# Patient Record
Sex: Male | Born: 1975 | Hispanic: Yes | Marital: Married | Smoking: Never smoker
Health system: Southern US, Community
[De-identification: ages and names within clinical notes are randomized; demographics above are authoritative.]

## PROBLEM LIST (undated history)

## (undated) DIAGNOSIS — G709 Myoneural disorder, unspecified: Secondary | ICD-10-CM

## (undated) DIAGNOSIS — I10 Essential (primary) hypertension: Secondary | ICD-10-CM

## (undated) DIAGNOSIS — R030 Elevated blood-pressure reading, without diagnosis of hypertension: Secondary | ICD-10-CM

## (undated) DIAGNOSIS — K219 Gastro-esophageal reflux disease without esophagitis: Secondary | ICD-10-CM

## (undated) DIAGNOSIS — IMO0001 Reserved for inherently not codable concepts without codable children: Secondary | ICD-10-CM

## (undated) DIAGNOSIS — G4730 Sleep apnea, unspecified: Secondary | ICD-10-CM

## (undated) HISTORY — PX: NO PAST SURGERIES: SHX2092

## (undated) HISTORY — DX: Gastro-esophageal reflux disease without esophagitis: K21.9

## (undated) HISTORY — DX: Sleep apnea, unspecified: G47.30

## (undated) HISTORY — DX: Myoneural disorder, unspecified: G70.9

---

## 2014-07-03 ENCOUNTER — Emergency Department: Payer: Self-pay | Admitting: Emergency Medicine

## 2015-03-05 NOTE — Consult Note (Signed)
PATIENT NAME:  Peter Porter, Peter Porter MR#:  [id number] DATE OF BIRTH:  Jul 29, 1976  DATE OF CONSULTATION:  07/03/2014  REFERRING PHYSICIAN:   CONSULTING PHYSICIAN:  Huey Romans, MD  CHIEF COMPLAINT:  Traumatic laceration to the face with a chainsaw.   HISTORY OF PRESENT ILLNESS: The patient is a 39 year old Hispanic male who has been very healthy. He was working on cutting down a tree. He had the tip of the blade pop back towards his forehead and just slightly graze the skin of his forehead and nose. He has multiple lacerations on his forehead and nose. He did not have any loss of consciousness.  He is, certainly, in pain, but otherwise is awake and alert.   PAST MEDICAL HISTORY: Significant for being very healthy, and no general medical problems.   REVIEW OF SYSTEMS:  He no other challenges.  His vision is clear. He can breathe well through his nose.  He is not having a nosebleed, just bleeding from the superficial lacerations.   CURRENT MEDICATIONS: None.   DRUG ALLERGIES: NONE.   SOCIAL HISTORY: He does not smoke.   PHYSICAL EXAMINATION: The patient is awake and alert and has lacerations on his forehead. These run superior and angled towards his left side. The first one is starting at his right brow and going up towards the left.  It is 2.5 cm in length and through full thickness skin, but not into the muscle. There is one the left side that starts in the glabella and goes up to the left brow and into the left forehead. It is 3 cm in length. Again, through full thickness skin, there is one in the middle between them running parallel that does not go all the way through the skin. There are 2 lacerations on the nasal dorsum.  The inferior one runs from the right side upward onto the left side.  It is 3.1 cm.  The upper one runs parallel to it and is just 2 cm slightly below where the left forehead laceration ends. This is also through full thickness skin. There was a small flap on the inferior  border of the inferior nasal laceration. There is also a small flap in the superior border of the right forehead laceration.  There are no other injuries around the eyes.  Extraocular movements are good. His nose looks healthy inside.  These did not go to through the muscle or cartilage, but just superficial skin.  There are no other injuries to his neck or face noted.   IMPRESSION: The patient has multiple superficial lacerations to his forehead and nose. These are sewn in the Emergency Room and that was dictated in detail elsewhere. He had bacitracin and Band-Aids placed over the wound.   PLAN: He is to follow-up in the office in 6 days for suture removal. He is to keep some ointment and a Band-Aid over the wounds 24 hours a day. He can be out of work on Monday, but can then returned to work wearing a Band-Aid and a hat to protect it from sun. All this for the rest of week. I have given him my office number and address so he knows how to get there, and to call if there are any problems.    ____________________________ Huey Romans, MD phj:TT D: 07/03/2014 17:54:50 ET T: 07/03/2014 18:39:14 ET JOB#: [id number]  cc: Huey Romans, MD, <Dictator> Huey Romans MD ELECTRONICALLY SIGNED 07/04/2014 22:24

## 2015-03-05 NOTE — Op Note (Signed)
PATIENT NAME:  Peter Porter, Peter Porter MR#:  [id number] DATE OF BIRTH:  06-30-1976  DATE OF PROCEDURE:  07/03/2014  PREOPERATIVE DIAGNOSES: Multiple traumatic facial lacerations secondary to chain saw.   POSTOPERATIVE DIAGNOSIS:  Multiple traumatic facial lacerations secondary to chain saw.  OPERATIVE PROCEDURES:  1.  Simple repair of lacerations of the nose to a total of 5.1 cm.  2.  Simple repair of lacerations of the forehead a total of 5.5 cm.   SURGEON: Huey Romans, M.D.   ANESTHESIA: Local.   COMPLICATIONS: None.   DESCRIPTION OF PROCEDURE: The patient was seen in the Emergency Room and lacerations were noted. These are through the skin, but not into the deep tissues. This was a chain saw injury that had mostly vertical, slightly angled lacerations, 3 of them in a row running up from the glabella and eyebrows into the mid forehead. The one to the right side was 2.5 cm. The one to the left was 3 cm.  The midline one was more superficial and did not require any sutures. He had transverse lacerations across the bridge of his nose. The inferior one was 3.1 cm.  The more superior one was 2 cm.  Both these were through full thickness skin but were gaping only slightly.   The wound was wash and then 1% Xylocaine with epinephrine 1:200,000 was used for infiltration of the forehead, as well as the bridge of the nose. Approximately 6 mL were used for anesthesia. Once this was completed, the area was prepped with Betadine to clean it very carefully. Once it was clean, closure was done with a 6-0 Prolene sutures. This was done in interrupted and some running locking sutures. The nasal lacerations were done first and then the forehead lacerations were done. These had some little flaps at the distal edges from the chain saw where it cut through slightly on one side deeper than the other, and these were sutured with the 3-0 nylon suture. The forehead was then sutured next, and again interrupted and running  locking sutures were placed to complete the suture repair. The wounds were then covered with bacitracin ointment, followed by a Band-Aid dressing. The patient tolerated the procedure well. This was done in the Emergency Room at his bedside. There were no operative complications.   ____________________________ Huey Romans, MD phj:TT D: 07/03/2014 17:49:37 ET T: 07/03/2014 20:21:46 ET JOB#: [id number]  cc: Huey Romans, MD, <Dictator> Huey Romans MD ELECTRONICALLY SIGNED 07/04/2014 22:24

## 2015-10-03 ENCOUNTER — Other Ambulatory Visit: Payer: Self-pay | Admitting: Family Medicine

## 2015-10-03 DIAGNOSIS — R2241 Localized swelling, mass and lump, right lower limb: Secondary | ICD-10-CM

## 2015-10-21 ENCOUNTER — Ambulatory Visit: Payer: Self-pay

## 2015-12-27 ENCOUNTER — Other Ambulatory Visit: Payer: Self-pay | Admitting: Student

## 2015-12-27 DIAGNOSIS — M25461 Effusion, right knee: Secondary | ICD-10-CM

## 2016-01-13 ENCOUNTER — Ambulatory Visit
Admission: RE | Admit: 2016-01-13 | Discharge: 2016-01-13 | Disposition: A | Discharge status: Home / Self Care | Payer: BLUE CROSS/BLUE SHIELD | Source: Ambulatory Visit | Attending: Student | Admitting: Student

## 2016-01-13 DIAGNOSIS — M25461 Effusion, right knee: Secondary | ICD-10-CM | POA: Insufficient documentation

## 2016-01-13 DIAGNOSIS — M23051 Cystic meniscus, posterior horn of lateral meniscus, right knee: Secondary | ICD-10-CM | POA: Diagnosis not present

## 2016-05-16 ENCOUNTER — Encounter: Payer: Self-pay | Admitting: *Deleted

## 2016-05-16 ENCOUNTER — Other Ambulatory Visit: Payer: BLUE CROSS/BLUE SHIELD

## 2016-05-16 NOTE — Patient Instructions (Signed)
  Your procedure is scheduled on: 05-22-16  Report to Same Day Surgery 2nd floor medical mall To find out your arrival time please call [phone] between 1PM - 3PM on 05-21-16  Remember: Instructions that are not followed completely may result in serious medical risk, up to and including death, or upon the discretion of your surgeon and anesthesiologist your surgery may need to be rescheduled.    _x___ 1. Do not eat food or drink liquids after midnight. No gum chewing or hard candies.     __x__ 2. No Alcohol for 24 hours before or after surgery.   __x__3. No Smoking for 24 prior to surgery.   ____  4. Bring all medications with you on the day of surgery if instructed.    __x__ 5. Notify your doctor if there is any change in your medical condition     (cold, fever, infections).     Do not wear jewelry, make-up, hairpins, clips or nail polish.  Do not wear lotions, powders, or perfumes. You may wear deodorant.  Do not shave 48 hours prior to surgery. Men may shave face and neck.  Do not bring valuables to the hospital.    [facility] is not responsible for any belongings or valuables.               Contacts, dentures or bridgework may not be worn into surgery.  Leave your suitcase in the car. After surgery it may be brought to your room.  For patients admitted to the hospital, discharge time is determined by your treatment team.   Patients discharged the day of surgery will not be allowed to drive home.    Please read over the following fact sheets that you were given:   [facility] Preparing for Surgery and or MRSA Information   ____ Take these medicines the morning of surgery with A SIP OF WATER:    1. NONE  2.  3.  4.  5.  6.  ____ Fleet Enema (as directed)   ____ Use CHG Soap or sage wipes as directed on instruction sheet   ____ Use inhalers on the day of surgery and bring to hospital day of surgery  ____ Stop metformin 2 days prior to surgery    ____ Take 1/2 of  usual insulin dose the night before surgery and none on the morning of surgery.   ____ Stop aspirin or coumadin, or plavix  _x__ Stop Anti-inflammatories such as Advil, Aleve, Ibuprofen, Motrin, Naproxen,          Naprosyn, Goodies powders or aspirin products. Ok to take Tylenol.   ____ Stop supplements until after surgery.    ____ Bring C-Pap to the hospital.

## 2016-05-22 ENCOUNTER — Ambulatory Visit: Admission: RE | Admit: 2016-05-22 | Payer: BLUE CROSS/BLUE SHIELD | Source: Ambulatory Visit | Admitting: Surgery

## 2016-05-22 HISTORY — DX: Reserved for inherently not codable concepts without codable children: IMO0001

## 2016-05-22 HISTORY — DX: Elevated blood-pressure reading, without diagnosis of hypertension: R03.0

## 2016-05-22 SURGERY — ARTHROSCOPY, KNEE, WITH MENISCUS REPAIR
Anesthesia: Choice | Laterality: Right

## 2016-12-01 IMAGING — MR MR KNEE*R* W/O CM
5 series · 39 of 40 positions shown · non-contrast
Comparison: None.

CLINICAL DATA: Swelling about the anterior aspect of the right
knee. No recent injury. History of injury playing soccer 4 years
ago. Initial encounter.

EXAM:
MRI OF THE RIGHT KNEE WITHOUT CONTRAST
TECHNIQUE: Multiplanar, multisequence MR imaging of the knee was performed. No
intravenous contrast was administered.

[Series 3: PD fat-sat · axial · 3.0mm · 0.31mm/px · z∈[-57,+65]mm · 8 of 38 slices shown (1 of 3)]
[im 1/38]
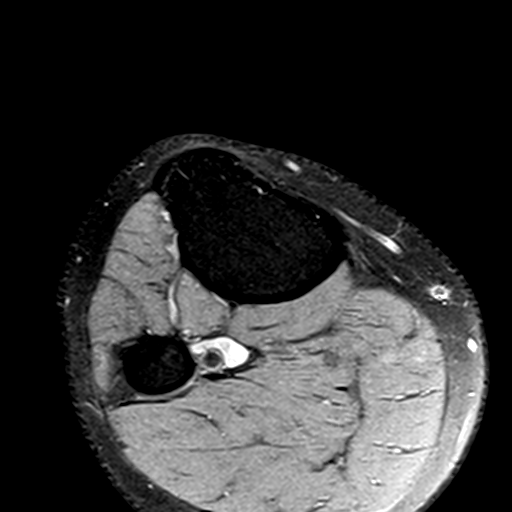
[im 6/38]
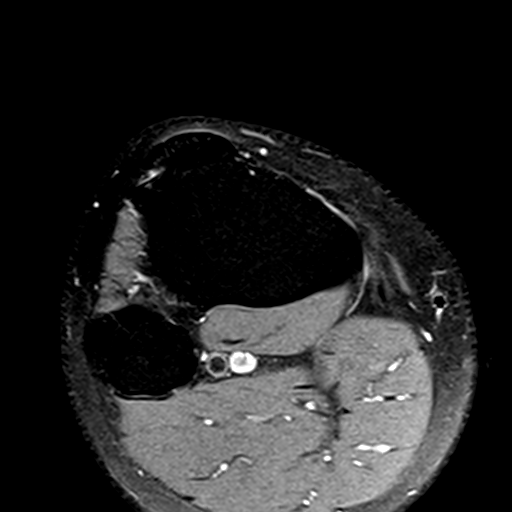
[im 11/38]
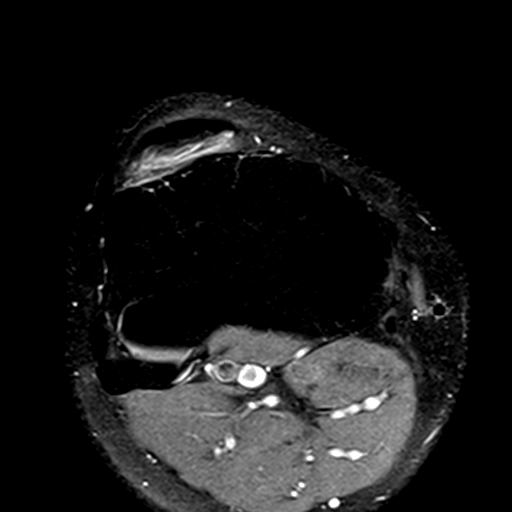
[im 16/38]
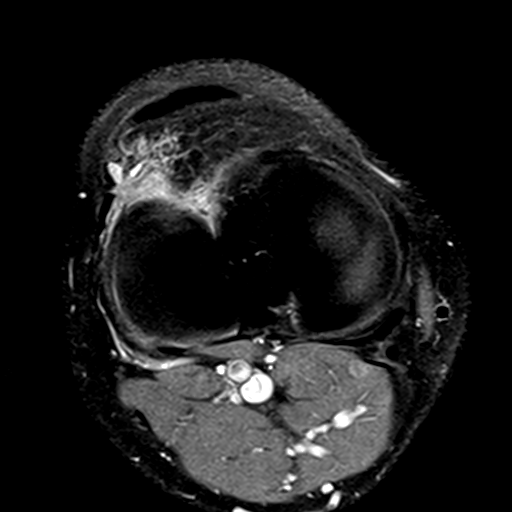
[im 22/38]
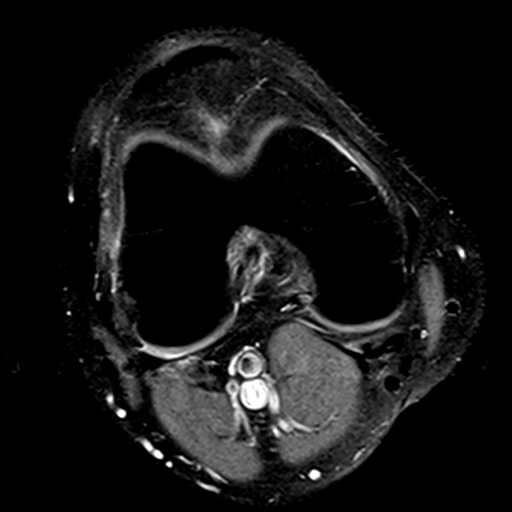
[im 27/38]
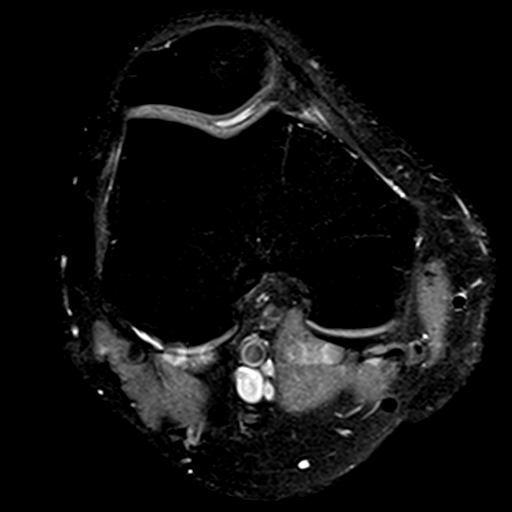
[im 32/38]
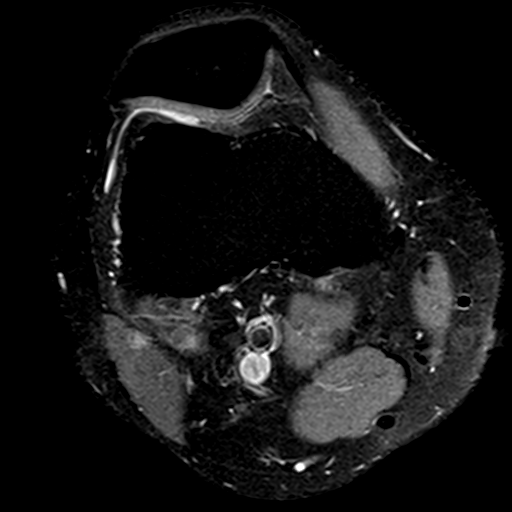
[im 38/38]
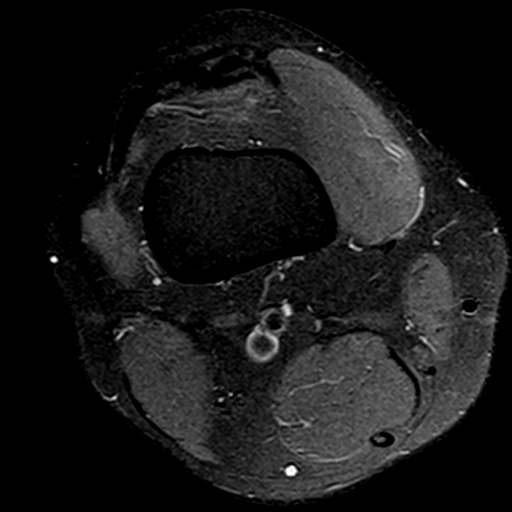

[Series 4: T1 · coronal · 3.0mm · 0.50mm/px · 7 of 36 slices shown]
[im 1/36]
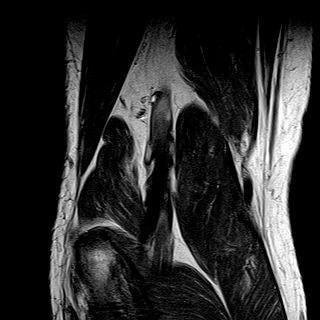
[im 6/36]
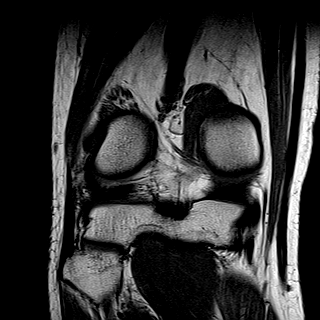
[im 11/36]
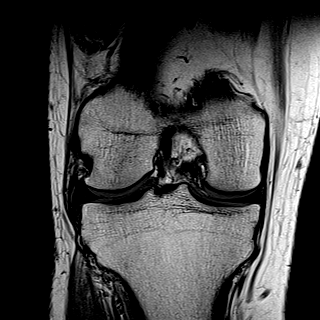
[im 16/36]
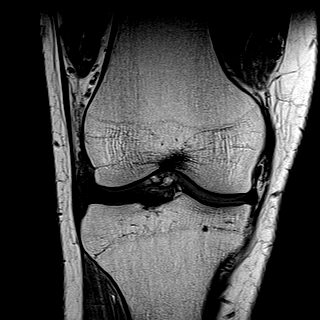
[im 21/36]
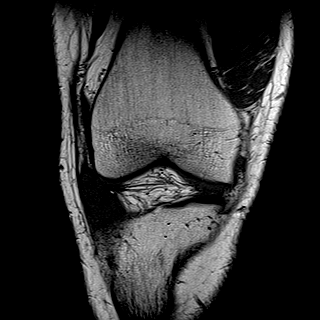
[im 26/36]
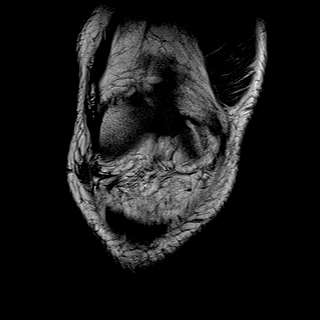
[im 31/36]
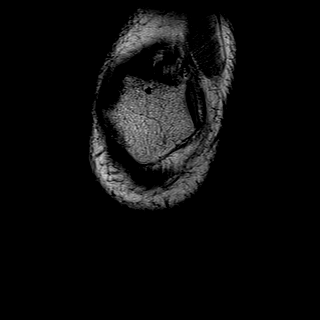

[Series 5: PD fat-sat · sagittal · 3.0mm · 0.62mm/px · 8 of 35 slices shown (2 of 3)]
[im 1/35]
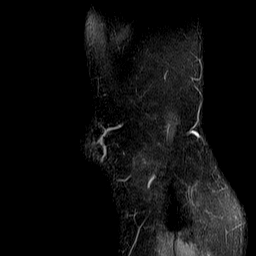
[im 5/35]
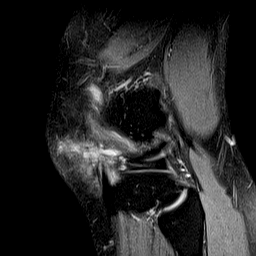
[im 10/35]
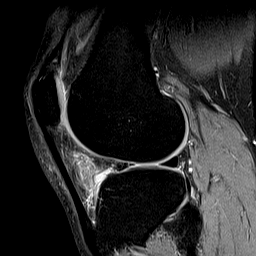
[im 15/35]
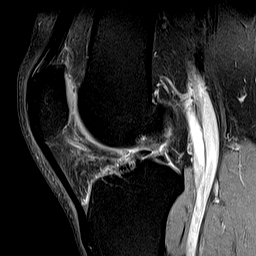
[im 20/35]
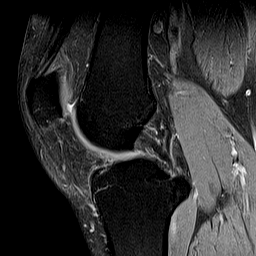
[im 25/35]
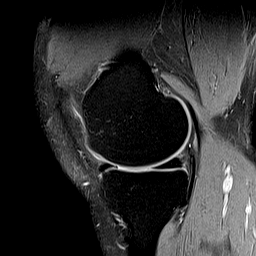
[im 30/35]
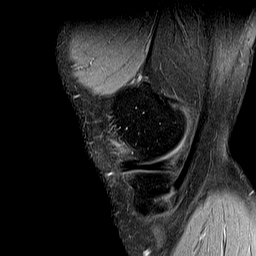
[im 35/35]
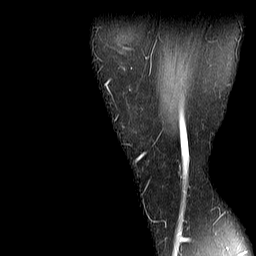

[Series 6: T2 fat-sat · coronal · 3.0mm · 0.50mm/px · 8 of 36 slices shown]
[im 1/36]
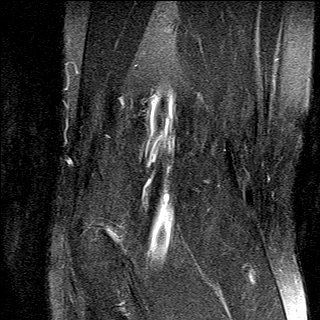
[im 6/36]
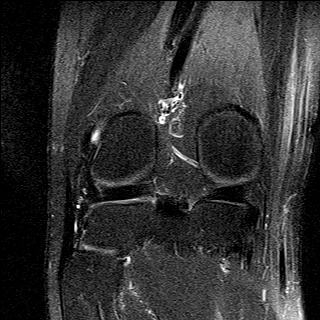
[im 11/36]
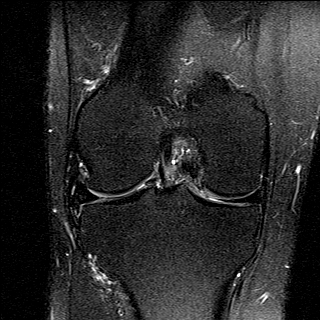
[im 16/36]
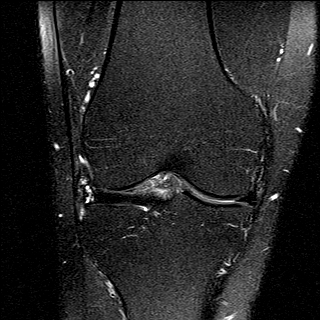
[im 21/36]
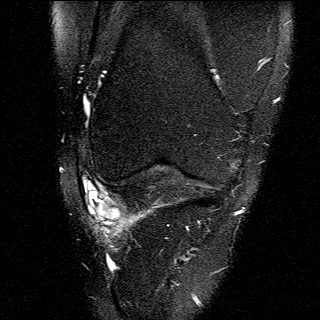
[im 26/36]
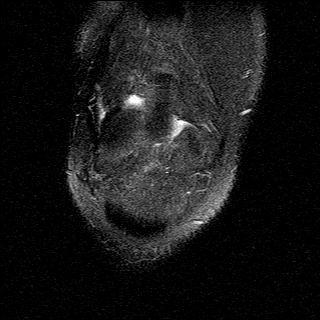
[im 31/36]
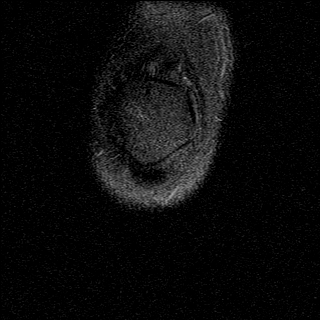
[im 36/36]
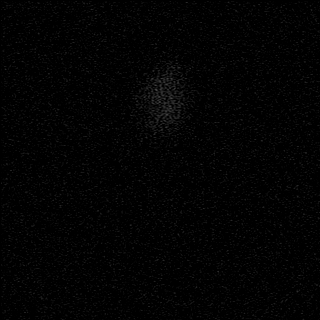

[Series 7: PD fat-sat · coronal · 3.0mm · 0.62mm/px · 8 of 36 slices shown (3 of 3)]
[im 1/36]
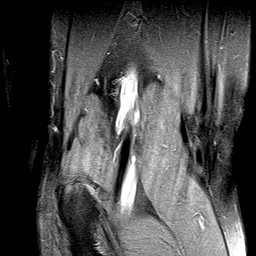
[im 6/36]
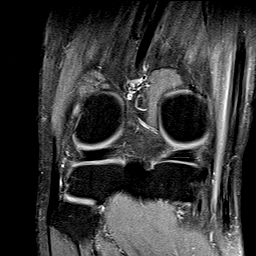
[im 11/36]
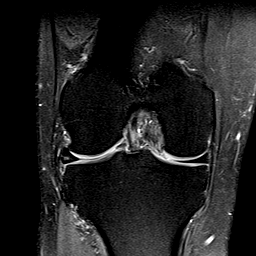
[im 16/36]
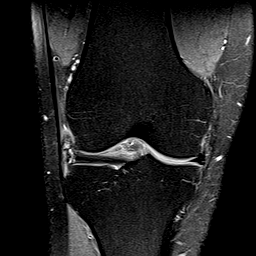
[im 21/36]
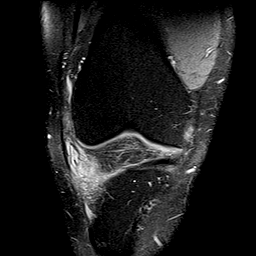
[im 26/36]
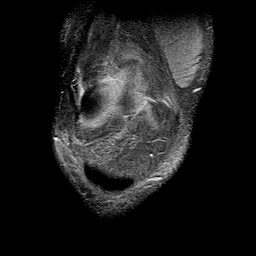
[im 31/36]
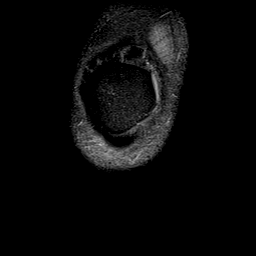
[im 36/36]
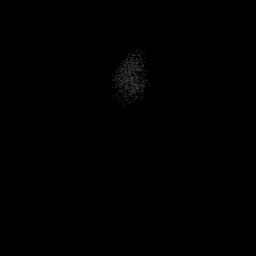

[39 of 40 positions shown; findings below may reference images not displayed]

FINDINGS: Intact.

MENISCI

Medial meniscus: Mild fraying is seen along the free edge of the
body. At the junction of anterior horn and body, there is a cystic
lesion contiguous with the capsular surface of the meniscus which
measures 2.0 cm AP x 2.2 cm craniocaudal x 1.8 cm transverse. This
could be a ganglion cyst or parameniscal cyst due to a capsular
surface tear. No meniscal tear reaching an articular surface is
identified.

Lateral meniscus:  Intact.

LIGAMENTS

Cruciates:  Intact.

Collaterals:  Intact.

CARTILAGE

Patellofemoral:  Appears normal.

Medial:  Appears normal.

Lateral:  Appears normal.

Joint:  No joint there is a trace amount of joint fluid.

Popliteal Fossa:  No Baker's cyst.

Extensor Mechanism:  Intact.

Bones:  Normal marrow signal throughout.
IMPRESSION: Cystic lesion adjacent to the capsular surface of the lateral
meniscus at the junction of the anterior horn and body could be a
ganglion cyst or a parameniscal cyst. There may be a tear of the
capsular surface of the lateral meniscus but no tear reaching an
articular surface is identified.

Negative for medial meniscal or cruciate ligament tear.

## 2020-01-30 ENCOUNTER — Ambulatory Visit: Payer: BC Managed Care – PPO | Attending: Internal Medicine

## 2020-01-30 DIAGNOSIS — Z23 Encounter for immunization: Secondary | ICD-10-CM

## 2020-01-30 NOTE — Progress Notes (Signed)
   Covid-19 Vaccination Clinic  Name:  Dwaun Sosebee    MRN: [id number] DOB: 1976-06-11  01/30/2020  Mr. Maka was observed post Covid-19 immunization for 15 minutes without incident. He was provided with Vaccine Information Sheet and instruction to access the V-Safe system.   Mr. Suiter was instructed to call 911 with any severe reactions post vaccine: Marland Kitchen Difficulty breathing  . Swelling of face and throat  . A fast heartbeat  . A bad rash all over body  . Dizziness and weakness   Immunizations Administered    Name Date Dose VIS Date Route   Pfizer COVID-19 Vaccine 01/30/2020  6:22 PM 0.3 mL 10/23/2019 Intramuscular   Manufacturer: [place]   Lot: [id number]   [place]: [id number]

## 2020-02-21 ENCOUNTER — Ambulatory Visit: Payer: BC Managed Care – PPO | Attending: Internal Medicine

## 2020-02-21 DIAGNOSIS — Z23 Encounter for immunization: Secondary | ICD-10-CM

## 2020-02-21 NOTE — Progress Notes (Signed)
   Covid-19 Vaccination Clinic  Name:  Lazer Meloche    MRN: [id number] DOB: March 10, 1976  02/21/2020  Mr. Pisano was observed post Covid-19 immunization for 15 minutes without incident. He was provided with Vaccine Information Sheet and instruction to access the V-Safe system.   Mr. Sylvain was instructed to call 911 with any severe reactions post vaccine: Marland Kitchen Difficulty breathing  . Swelling of face and throat  . A fast heartbeat  . A bad rash all over body  . Dizziness and weakness   Immunizations Administered    Name Date Dose VIS Date Route   Pfizer COVID-19 Vaccine 02/21/2020  3:36 PM 0.3 mL 10/23/2019 Intramuscular   Manufacturer: [place]   Lot: [id number]   [place]: [id number]

## 2020-03-11 ENCOUNTER — Ambulatory Visit: Payer: BC Managed Care – PPO

## 2021-02-27 ENCOUNTER — Other Ambulatory Visit: Payer: Self-pay

## 2021-02-27 ENCOUNTER — Ambulatory Visit
Admission: EM | Admit: 2021-02-27 | Discharge: 2021-02-27 | Disposition: A | Discharge status: Home / Self Care | Payer: BC Managed Care – PPO

## 2021-02-27 DIAGNOSIS — L989 Disorder of the skin and subcutaneous tissue, unspecified: Secondary | ICD-10-CM

## 2021-02-27 HISTORY — DX: Essential (primary) hypertension: I10

## 2021-02-27 NOTE — Discharge Instructions (Addendum)
You need to see a dermatologist to have the skin lesion on your eyelid and on your neck evaluated.  I have secured an appointment for you with [facility] this Wednesday, 03/01/2021 at 12 PM.  Their address is [address]. [place], [place]. [address] [phone]

## 2021-02-27 NOTE — ED Provider Notes (Signed)
[facility]    CSN: [id number] Arrival date & time: 02/27/21  0846      History   Chief Complaint Chief Complaint  Patient presents with  . Eyelid Problem    Right lower    HPI Peter Porter is a 45 y.o. male.   HPI   45 year old male here for evaluation of a growth on his right eyelid and left neck.  Patient reports that the growth on his right eyelid has been getting bigger over the last year and he is concerned that it might be cancer.  Patient also has another area on his left neck near his collarbone that he is also worried about.  Patient reports that he has been evaluated by his PCP who stated that they would make a referral for him but has not heard from anybody.  He called them again today and he was directed to come to the urgent care to have this matter remedied.  Spanish interpreter Peter Porter [phone] used for HPI, assessment, and discharge.  Past Medical History:  Diagnosis Date  . Elevated blood pressure    NO MEDS  . Hypertension     There are no problems to display for this patient.   Past Surgical History:  Procedure Laterality Date  . NO PAST SURGERIES         Home Medications    Prior to Admission medications   Not on File    Family History History reviewed. No pertinent family history.  Social History Social History   Tobacco Use  . Smoking status: Never Smoker  . Smokeless tobacco: Never Used  Vaping Use  . Vaping Use: Never used  Substance Use Topics  . Alcohol use: No  . Drug use: No     Allergies   Patient has no known allergies.   Review of Systems Review of Systems  Skin: Negative for color change and wound.     Physical Exam Triage Vital Signs ED Triage Vitals  Enc Vitals Group     BP 02/27/21 0925 (!) 150/123     Pulse Rate 02/27/21 0925 (!) 59     Resp 02/27/21 0925 18     Temp 02/27/21 0925 97.9 F (36.6 C)     Temp Source 02/27/21 0925 Oral     SpO2 02/27/21 0925 100 %     Weight  02/27/21 0922 240 lb (108.9 kg)     Height 02/27/21 0922 5\' 9"  (1.753 m)     Head Circumference --      Peak Flow --      Pain Score 02/27/21 0922 0     Pain Loc --      Pain Edu? --      Excl. in [place]? --    No data found.  Updated Vital Signs BP (!) 150/123 (BP Location: Left Arm)   Pulse (!) 59   Temp 97.9 F (36.6 C) (Oral)   Resp 18   Ht 5\' 9"  (1.753 m)   Wt 240 lb (108.9 kg)   SpO2 100%   BMI 35.44 kg/m   Visual Acuity Right Eye Distance:   Left Eye Distance:   Bilateral Distance:    Right Eye Near:   Left Eye Near:    Bilateral Near:     Physical Exam Vitals and nursing note reviewed.  Constitutional:      General: He is not in acute distress.    Appearance: Normal appearance. He is normal weight.  HENT:  Head: Normocephalic and atraumatic.  Skin:    General: Skin is warm and dry.     Capillary Refill: Capillary refill takes less than 2 seconds.     Findings: Lesion present.  Neurological:     General: No focal deficit present.     Mental Status: He is alert and oriented to person, place, and time.  Psychiatric:        Mood and Affect: Mood normal.        Behavior: Behavior normal.        Thought Content: Thought content normal.        Judgment: Judgment normal.      UC Treatments / Results  Labs (all labs ordered are listed, but only abnormal results are displayed) Labs Reviewed - No data to display  EKG   Radiology No results found.  Procedures Procedures (including critical care time)  Medications Ordered in UC Medications - No data to display  Initial Impression / Assessment and Plan / UC Course  I have reviewed the triage vital signs and the nursing notes.  Pertinent labs & imaging results that were available during my care of the patient were reviewed by me and considered in my medical decision making (see chart for details).   Patient is a very pleasant 45 year old Spanish-speaking male here for evaluation of a growth on his  right eyelid and on his left neck that has been present for at least a year.  Patient reports that they have been growing in size and he is concerned that they might be cancer.  Patient was directed to come here by his PCP.  I informed the patient that we cannot do any removal of skin lesions or biopsies to determine if there are cancer and that he would need to be seen by dermatology.  Patient reports that he was assured by his PCP that we could take care of this matter and I reinforced to him that I do not have the ability to collect a biopsy, remove skin lesions, or skin tags.  Patient does have a 5 mm x 5 mm raised, multicolored lesion on his right lower eyelid just lateral of midline.  He has another smaller 1 mm x 1 mm skin tag on his left neck near his collarbone.  I informed patient that I would refer him to [facility] and try and get him an appointment.  I was able to secure patient appointment for this Wednesday, March 01, 2021 at 12 PM.   Final Clinical Impressions(s) / UC Diagnoses   Final diagnoses:  Skin lesion     Discharge Instructions     You need to see a dermatologist to have the skin lesion on your eyelid and on your neck evaluated.  I have secured an appointment for you with [facility] this Wednesday, 03/01/2021 at 12 PM.  Their address is [address]. [place], [place]. [address] [phone]    ED Prescriptions    None     PDMP not reviewed this encounter.   Margarette Canada, NP 02/27/21 1010

## 2021-02-27 NOTE — ED Triage Notes (Signed)
Pt c/o possible growth under his right eyelid. Pt states it seems to be getting bigger over the past few months. Pt denies any pain. Pt also has a possible mole on his left chest/clavicle area.

## 2021-03-01 ENCOUNTER — Ambulatory Visit: Payer: BC Managed Care – PPO | Admitting: Dermatology

## 2021-03-01 ENCOUNTER — Other Ambulatory Visit: Payer: Self-pay

## 2021-03-01 DIAGNOSIS — L814 Other melanin hyperpigmentation: Secondary | ICD-10-CM | POA: Diagnosis not present

## 2021-03-01 DIAGNOSIS — D229 Melanocytic nevi, unspecified: Secondary | ICD-10-CM

## 2021-03-01 DIAGNOSIS — L820 Inflamed seborrheic keratosis: Secondary | ICD-10-CM

## 2021-03-01 DIAGNOSIS — L821 Other seborrheic keratosis: Secondary | ICD-10-CM

## 2021-03-01 NOTE — Patient Instructions (Signed)

## 2021-03-01 NOTE — Progress Notes (Signed)
   New Patient Visit  Subjective  Peter Porter is a 45 y.o. male who presents for the following: Lesions (Of the right lower eyelid, L cheek, and L neck - growing in size and symptomatic; patient is concerned and would like to discuss treatment options). Interpreter used today Verdis Frederickson   The following portions of the chart were reviewed this encounter and updated as appropriate:   Tobacco  Allergies  Meds  Problems  Med Hx  Surg Hx  Fam Hx     Review of Systems:  No other skin or systemic complaints except as noted in HPI or Assessment and Plan.  Objective  Well appearing patient in no apparent distress; mood and affect are within normal limits.  A focused examination was performed including the face and neck. Relevant physical exam findings are noted in the Assessment and Plan.  Objective  L cheek x 2, L lat neck x 1 (3): Erythematous keratotic or waxy stuck-on papule or plaque.   Images      Objective  R lower eyelid margin x 1: Erythematous keratotic or waxy stuck-on papule or plaque.   Images    Assessment & Plan  Inflamed seborrheic keratosis (3) L cheek x 2, L lat neck x 1  Destruction of lesion - L cheek x 2, L lat neck x 1 Complexity: simple   Destruction method: cryotherapy   Informed consent: discussed and consent obtained   Timeout:  patient name, date of birth, surgical site, and procedure verified Lesion destroyed using liquid nitrogen: Yes   Region frozen until ice ball extended beyond lesion: Yes   Outcome: patient tolerated procedure well with no complications   Post-procedure details: wound care instructions given    Seborrheic keratosis, inflamed R lower eyelid margin x 1  Destruction of lesion - R lower eyelid margin x 1 Complexity: simple   Destruction method: cryotherapy   Informed consent: discussed and consent obtained   Timeout:  patient name, date of birth, surgical site, and procedure verified Lesion destroyed using liquid  nitrogen: Yes   Region frozen until ice ball extended beyond lesion: Yes   Outcome: patient tolerated procedure well with no complications   Post-procedure details: wound care instructions given     Seborrheic Keratoses - Stuck-on, waxy, tan-brown papules and/or plaques  - Benign-appearing - Discussed benign etiology and prognosis. - Observe - Call for any changes  Lentigines - Scattered tan macules - Due to sun exposure - Benign-appering, observe - Recommend daily broad spectrum sunscreen SPF 30+ to sun-exposed areas, reapply every 2 hours as needed. - Call for any changes  Melanocytic Nevi - Tan-brown and/or pink-flesh-colored symmetric macules and papules - Benign appearing on exam today - Observation - Call clinic for new or changing moles - Recommend daily use of broad spectrum spf 30+ sunscreen to sun-exposed areas.   Return in 2 months (on 05/01/2021), or if symptoms worsen or fail to improve, for ISK recheck .  Luther Redo, CMA, am acting as scribe for Sarina Ser, MD .  Documentation: I have reviewed the above documentation for accuracy and completeness, and I agree with the above.  Sarina Ser, MD

## 2021-03-05 ENCOUNTER — Encounter: Payer: Self-pay | Admitting: Dermatology

## 2021-05-04 ENCOUNTER — Ambulatory Visit: Payer: BC Managed Care – PPO | Admitting: Dermatology

## 2021-05-04 ENCOUNTER — Other Ambulatory Visit: Payer: Self-pay

## 2022-09-19 DIAGNOSIS — B349 Viral infection, unspecified: Secondary | ICD-10-CM | POA: Diagnosis not present

## 2022-11-06 DIAGNOSIS — S29012A Strain of muscle and tendon of back wall of thorax, initial encounter: Secondary | ICD-10-CM | POA: Diagnosis not present

## 2023-01-21 ENCOUNTER — Ambulatory Visit: Payer: BC Managed Care – PPO | Admitting: Physician Assistant

## 2023-01-21 ENCOUNTER — Encounter: Payer: Self-pay | Admitting: Physician Assistant

## 2023-01-21 VITALS — BP 138/108 | HR 95 | Wt 244.0 lb

## 2023-01-21 DIAGNOSIS — D3192 Benign neoplasm of unspecified part of left eye: Secondary | ICD-10-CM | POA: Insufficient documentation

## 2023-01-21 DIAGNOSIS — E669 Obesity, unspecified: Secondary | ICD-10-CM | POA: Diagnosis not present

## 2023-01-21 DIAGNOSIS — Z1159 Encounter for screening for other viral diseases: Secondary | ICD-10-CM | POA: Insufficient documentation

## 2023-01-21 DIAGNOSIS — K219 Gastro-esophageal reflux disease without esophagitis: Secondary | ICD-10-CM

## 2023-01-21 DIAGNOSIS — D3191 Benign neoplasm of unspecified part of right eye: Secondary | ICD-10-CM | POA: Insufficient documentation

## 2023-01-21 DIAGNOSIS — Z7689 Persons encountering health services in other specified circumstances: Secondary | ICD-10-CM | POA: Insufficient documentation

## 2023-01-21 DIAGNOSIS — Z114 Encounter for screening for human immunodeficiency virus [HIV]: Secondary | ICD-10-CM | POA: Diagnosis not present

## 2023-01-21 DIAGNOSIS — H579 Unspecified disorder of eye and adnexa: Secondary | ICD-10-CM | POA: Insufficient documentation

## 2023-01-21 MED ORDER — OMEPRAZOLE 20 MG PO CPDR: 20.0000 mg | DELAYED_RELEASE_CAPSULE | Freq: Every day | ORAL | 3 refills | Status: DC

## 2023-01-21 NOTE — Progress Notes (Signed)
New patient visit   Patient: Peter Porter   DOB: Dec 09, 1975   47 y.o. Male  MRN: [id number] Visit Date: 01/21/2023  Today's healthcare provider: Mardene Speak, PA-C   CC: establish care, GERD exacerbation, neck pain  Subjective    Peter Porter is a 47 y.o. male who presents today as a new patient to establish care.  Patient complains of significant epigastric pain. This has been associated with abdominal bloating, belching, fullness after meals, and heartburn. He denies bilious reflux, chest pain, choking on food, cough, deep pressure at base of neck, difficulty swallowing, hematemesis, hoarseness, and laryngitis. Symptoms have been present for years. He denies dysphagia. Peter Porter has not lost weight. He had used Prilosec with some relief but then it did not work any more.  Pain associated with meals, it is worse in the mornings, disturbs his sleep, makes him dizzy and makes his body weak  Reports having neck pain with head movements, numbness and tingling sensation in his left arm. Onset: December of 2023. Denies having trauma or injury. Does heavy lifting at work. Tried chiropractic sessions. Spanish-speaking interpreter: Peter Porter  Past Medical History:  Diagnosis Date   Elevated blood pressure    NO MEDS   GERD (gastroesophageal reflux disease)    Hypertension    Neuromuscular disorder (HCC)    Sleep apnea    Past Surgical History:  Procedure Laterality Date   NO PAST SURGERIES     Family Status  Relation Name Status   Mother  (Not Specified)   Father  (Not Specified)   PGM  (Not Specified)   Family History  Problem Relation Age of Onset   Osteoporosis Mother    Diabetes Father    Cancer Paternal Grandmother    Social History   Socioeconomic History   Marital status: Married    Spouse name: Not on file   Number of children: Not on file   Years of education: Not on file   Highest education level: Not on file  Occupational History   Not on file  Tobacco Use    Smoking status: Never   Smokeless tobacco: Never  Vaping Use   Vaping Use: Never used  Substance and Sexual Activity   Alcohol use: Yes    Alcohol/week: 3.0 standard drinks of alcohol    Types: 1 Glasses of wine, 2 Cans of beer per week   Drug use: No   Sexual activity: Not on file  Other Topics Concern   Not on file  Social History Narrative   Not on file   Social Determinants of Health   Financial Resource Strain: Not on file  Food Insecurity: Not on file  Transportation Needs: Not on file  Physical Activity: Not on file  Stress: Not on file  Social Connections: Not on file   No outpatient medications prior to visit.   No facility-administered medications prior to visit.   No Known Allergies  Immunization History  Administered Date(s) Administered   PFIZER(Purple Top)SARS-COV-2 Vaccination 01/30/2020, 02/21/2020    Health Maintenance  Topic Date Due   HIV Screening  Never done   Hepatitis C Screening  Never done   DTaP/Tdap/Td (1 - Tdap) Never done   COVID-19 Vaccine (3 - Pfizer risk series) 03/20/2020   INFLUENZA VACCINE  Never done   HPV VACCINES  Aged Out    Patient Care Team: Patient, No Pcp Per as PCP - General (General Practice)  Review of Systems  Respiratory:  Positive for apnea.  Gastrointestinal:  Positive for abdominal distention, abdominal pain and nausea.  Musculoskeletal:  Positive for neck pain.  Neurological:  Positive for numbness.       Objective    BP (!) 138/108 (BP Location: Left Arm, Patient Position: Sitting, Cuff Size: Large)   Pulse 95   Wt 244 lb (110.7 kg)   SpO2 95%   BMI 36.03 kg/m    Physical Exam Vitals reviewed.  Constitutional:      General: He is not in acute distress.    Appearance: Normal appearance. He is not diaphoretic.  HENT:     Head: Normocephalic and atraumatic.  Eyes:     General: No scleral icterus.    Conjunctiva/sclera: Conjunctivae normal.  Cardiovascular:     Rate and Rhythm: Normal rate  and regular rhythm.     Pulses: Normal pulses.     Heart sounds: Normal heart sounds. No murmur heard. Pulmonary:     Effort: Pulmonary effort is normal. No respiratory distress.     Breath sounds: Normal breath sounds. No wheezing or rhonchi.  Musculoskeletal:     Cervical back: Neck supple.     Right lower leg: No edema.     Left lower leg: No edema.  Lymphadenopathy:     Cervical: No cervical adenopathy.  Skin:    General: Skin is warm and dry.     Findings: No rash.  Neurological:     Mental Status: He is alert and oriented to person, place, and time. Mental status is at baseline.  Psychiatric:        Mood and Affect: Mood normal.        Behavior: Behavior normal.     Depression Screen    01/21/2023    2:25 PM  PHQ 2/9 Scores  PHQ - 2 Score 1  PHQ- 9 Score 3   No results found for any visits on 01/21/23.  Assessment & Plan     Obesity (BMI 30-39.9) Chronic and stable  BMI today was 36.03 Initial workup - Lipid panel - CBC with Differential/Platelet - Comprehensive metabolic panel - TSH - omeprazole (PRILOSEC) 20 MG capsule; Take 1 capsule (20 mg total) by mouth daily.  Dispense: 60 capsule; Refill: 3 Advised low carb, low cholesterol diet and daily exercise. Will FU  Gastroesophageal reflux disease without esophagitis Chronic and worsening Recommended a trial of PPI Elevate the head of the bed 6-8 inches, avoid recumbency for 3 hours after eating, avoid food, weight loss  advised - omeprazole (PRILOSEC) 20 MG capsule; Take 1 capsule (20 mg total) by mouth twice daily.  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 Artificial tears recommended.  Referral to ophthalmology ordered  Encounter to establish care Welcomed to our clinic Reviewed past medical hx, social hx, family hx and surgical hx Pt advised to sign a release form for  his old records Including her vaccination records/tdap was done 9 years ago.  Need for hepatitis C  screening test - Hepatitis C antibody  Encounter for screening for HIV - HIV Antibody (routine testing w rflx) No follow-ups on file.    The patient was advised to call back or seek an in-person evaluation if the symptoms worsen or if the condition fails to improve as anticipated.  I discussed the assessment and treatment plan with the patient. The patient was provided an opportunity to ask questions and all were answered. The patient agreed with the plan and demonstrated an understanding of the instructions.  I, Mardene Speak, PA-C have reviewed all documentation for this visit. The documentation on  01/21/23 for the exam, diagnosis, procedures, and orders are all accurate and complete.  Mardene Speak, [facility], [place] [phone] (phone) [phone] (fax)   [place]

## 2023-01-25 DIAGNOSIS — E669 Obesity, unspecified: Secondary | ICD-10-CM | POA: Diagnosis not present

## 2023-01-26 LAB — CBC WITH DIFFERENTIAL/PLATELET
Basophils Absolute: 0.1 10*3/uL (ref 0.0–0.2)
Eos: 3 %
RDW: 13.5 % (ref 11.6–15.4)

## 2023-01-26 LAB — COMPREHENSIVE METABOLIC PANEL
ALT: 71 IU/L — ABNORMAL HIGH (ref 0–44)
Albumin/Globulin Ratio: 1.6 (ref 1.2–2.2)
Albumin: 4.4 g/dL (ref 4.1–5.1)
Globulin, Total: 2.7 g/dL (ref 1.5–4.5)

## 2023-01-26 LAB — TSH: TSH: 2 u[IU]/mL (ref 0.450–4.500)

## 2023-01-31 LAB — COMPREHENSIVE METABOLIC PANEL
AST: 39 IU/L (ref 0–40)
BUN: 8 mg/dL (ref 6–24)
Glucose: 93 mg/dL (ref 70–99)
Sodium: 137 mmol/L (ref 134–144)
eGFR: 111 mL/min/{1.73_m2} (ref >59)

## 2023-01-31 LAB — CBC WITH DIFFERENTIAL/PLATELET
Basos: 1 %
EOS (ABSOLUTE): 0.2 10*3/uL (ref 0.0–0.4)
Hemoglobin: 14.6 g/dL (ref 13.0–17.7)
MCH: 26.5 pg — ABNORMAL LOW (ref 26.6–33.0)
MCV: 81 fL (ref 79–97)
Platelets: 264 10*3/uL (ref 150–450)
WBC: 7.2 10*3/uL (ref 3.4–10.8)

## 2023-01-31 LAB — HIV ANTIBODY (ROUTINE TESTING W REFLEX)

## 2023-01-31 LAB — LIPID PANEL: Cholesterol, Total: 145 mg/dL (ref 100–199)

## 2023-02-01 LAB — CBC WITH DIFFERENTIAL/PLATELET
Immature Grans (Abs): 0 10*3/uL (ref 0.0–0.1)
MCHC: 32.7 g/dL (ref 31.5–35.7)
Monocytes Absolute: 0.5 10*3/uL (ref 0.1–0.9)
Monocytes: 8 %
Neutrophils: 54 %
RBC: 5.51 x10E6/uL (ref 4.14–5.80)

## 2023-02-01 LAB — LIPID PANEL
HDL: 31 mg/dL — ABNORMAL LOW (ref >39)
Triglycerides: 197 mg/dL — ABNORMAL HIGH (ref 0–149)
VLDL Cholesterol Cal: 34 mg/dL (ref 5–40)

## 2023-02-01 LAB — COMPREHENSIVE METABOLIC PANEL
BUN/Creatinine Ratio: 10 (ref 9–20)
Bilirubin Total: 0.6 mg/dL (ref 0.0–1.2)
Chloride: 105 mmol/L (ref 96–106)
Creatinine, Ser: 0.81 mg/dL (ref 0.76–1.27)
Total Protein: 7.1 g/dL (ref 6.0–8.5)

## 2023-02-01 LAB — HEPATITIS C ANTIBODY: Hep C Virus Ab: NONREACTIVE

## 2023-02-02 LAB — CBC WITH DIFFERENTIAL/PLATELET
Hematocrit: 44.6 % (ref 37.5–51.0)
Immature Granulocytes: 0 %
Lymphocytes Absolute: 2.4 10*3/uL (ref 0.7–3.1)
Lymphs: 34 %
Neutrophils Absolute: 4 10*3/uL (ref 1.4–7.0)

## 2023-02-02 LAB — LIPID PANEL
Chol/HDL Ratio: 4.7 ratio (ref 0.0–5.0)
LDL Chol Calc (NIH): 80 mg/dL (ref 0–99)

## 2023-02-02 LAB — COMPREHENSIVE METABOLIC PANEL
Alkaline Phosphatase: 72 IU/L (ref 44–121)
CO2: 22 mmol/L (ref 20–29)
Calcium: 9.2 mg/dL (ref 8.7–10.2)
Potassium: 4.5 mmol/L (ref 3.5–5.2)

## 2023-02-05 NOTE — Progress Notes (Signed)
Please, let pt know that his lab results were WNL except Elevated TGL and low HDL - advised lifestyle modification of low cholesterol diet and regular exercise Elevated liver enzyme - advised healthy diet and daily exercise Advised to repeat CMP at your FU or in 3 mo The 10-year ASCVD risk score (risk for heart attack and stroke) is: 2.2%

## 2023-02-21 NOTE — Progress Notes (Unsigned)
I,Peter Porter,acting as a Neurosurgeon for [employer], PA-C.,have documented all relevant documentation on the behalf of Peter Lat, PA-C,as directed by  [employer], PA-C while in the presence of [employer], PA-C.    Complete physical exam   Patient: Peter Porter   DOB: 1976/04/26   47 y.o. Male  MRN: [id number] Visit Date: 02/22/2023  Today's healthcare provider: Debera Lat, PA-C   Chief Complaint  Patient presents with   Gastroesophageal Reflux   Annual Exam  Peter Porter  [id number] [place] [id number] Subjective    [name] is a 47 y.o. male who presents today for a complete physical exam.  He reports consuming a general diet. The patient does not participate in regular exercise at present. He generally feels well. He reports sleeping well. He does not have additional problems to discuss today.  HPI  He reports getting Tdap at a hospital in [place] due to a cut at work at the beginning of this year.  He will send [place] records from there.  Follow up for GERD  The patient was last seen for this 1 months ago. Changes made at last visit include start omeprazole .  He reports fair compliance with treatment. Reports stopping medication since symptoms improved. But is having burning sensation first thing in the morning.  He feels that condition is Improved. He is not having side effects.   ----------------------------------------------------------------------------------------- Pt reports having SBP 140 am , 130 - 131 PM Denies having headache or visual disturbances   Past Medical History:  Diagnosis Date   Elevated blood pressure    NO MEDS   GERD (gastroesophageal reflux disease)    Hypertension    Neuromuscular disorder    Sleep apnea    Past Surgical History:  Procedure Laterality Date   NO PAST SURGERIES     Social History   Socioeconomic History   Marital status: Married    Spouse name: Not on file   Number of children: Not on file   Years of  education: Not on file   Highest education level: Not on file  Occupational History   Not on file  Tobacco Use   Smoking status: Never   Smokeless tobacco: Never  Vaping Use   Vaping Use: Never used  Substance and Sexual Activity   Alcohol use: Yes    Alcohol/week: 3.0 standard drinks of alcohol    Types: 1 Glasses of wine, 2 Cans of beer per week   Drug use: No   Sexual activity: Not on file  Other Topics Concern   Not on file  Social History Narrative   Not on file   Social Determinants of Health   Financial Resource Strain: Not on file  Food Insecurity: Not on file  Transportation Needs: Not on file  Physical Activity: Not on file  Stress: Not on file  Social Connections: Not on file  Intimate Partner Violence: Not on file   Family Status  Relation Name Status   Mother  (Not Specified)   Father  (Not Specified)   PGM  (Not Specified)   Family History  Problem Relation Age of Onset   Osteoporosis Mother    Diabetes Father    Cancer Paternal Grandmother    No Known Allergies  Patient Care Team: Peter Lat, PA-C as PCP - General (Physician Assistant)   Medications: Outpatient Medications Prior to Visit  Medication Sig   [DISCONTINUED] omeprazole (PRILOSEC) 20 MG capsule Take 1 capsule (20 mg total) by mouth daily.  No facility-administered medications prior to visit.    Review of Systems  All other systems reviewed and are negative. See above    Objective    BP (!) 126/92 (BP Location: Left Arm, Cuff Size: Large)   Pulse 75   Temp 98 F (36.7 C) (Temporal)   Resp 16   Wt 244 lb (110.7 kg)   SpO2 100%   BMI 36.03 kg/m     Physical Exam Vitals and nursing note reviewed.  Constitutional:      General: He is not in acute distress.    Appearance: Normal appearance. He is obese. He is not ill-appearing, toxic-appearing or diaphoretic.  HENT:     Head: Normocephalic and atraumatic.     Right Ear: Tympanic membrane, ear canal and external  ear normal.     Left Ear: Tympanic membrane, ear canal and external ear normal.     Nose: Nose normal. No congestion or rhinorrhea.     Mouth/Throat:     Mouth: Mucous membranes are moist.     Pharynx: Oropharynx is clear. No oropharyngeal exudate or posterior oropharyngeal erythema.  Eyes:     General: No scleral icterus.       Right eye: No discharge.        Left eye: No discharge.     Extraocular Movements: Extraocular movements intact.     Conjunctiva/sclera: Conjunctivae normal.     Pupils: Pupils are equal, round, and reactive to light.  Neck:     Vascular: No carotid bruit.  Cardiovascular:     Rate and Rhythm: Normal rate and regular rhythm.     Pulses: Normal pulses.     Heart sounds: Normal heart sounds. No murmur heard.    No friction rub. No gallop.  Pulmonary:     Effort: Pulmonary effort is normal. No respiratory distress.     Breath sounds: Normal breath sounds. No stridor. No wheezing, rhonchi or rales.  Chest:     Chest wall: No tenderness.  Abdominal:     General: Abdomen is flat. Bowel sounds are normal. There is no distension.     Palpations: Abdomen is soft. There is no mass.     Tenderness: There is no abdominal tenderness. There is no right CVA tenderness, left CVA tenderness, guarding or rebound.     Hernia: No hernia is present.  Musculoskeletal:        General: No swelling, tenderness, deformity or signs of injury. Normal range of motion.     Cervical back: Normal range of motion. No rigidity or tenderness.     Right lower leg: No edema.     Left lower leg: No edema.  Lymphadenopathy:     Cervical: No cervical adenopathy.  Skin:    General: Skin is warm.     Capillary Refill: Capillary refill takes less than 2 seconds.     Coloration: Skin is not jaundiced or pale.     Findings: No bruising, erythema, lesion or rash.  Neurological:     Mental Status: He is alert and oriented to person, place, and time. Mental status is at baseline.     Cranial  Nerves: No cranial nerve deficit.     Sensory: No sensory deficit.     Motor: No weakness.     Coordination: Coordination normal.     Gait: Gait normal.     Deep Tendon Reflexes: Reflexes normal.  Psychiatric:        Behavior: Behavior normal.  Thought Content: Thought content normal.        Judgment: Judgment normal.     Last depression screening scores    01/21/2023    2:25 PM  PHQ 2/9 Scores  PHQ - 2 Score 1  PHQ- 9 Score 3   Last fall risk screening    01/21/2023    2:25 PM  Fall Risk   Falls in the past year? 0  Number falls in past yr: 0  Injury with Fall? 0   Last Audit-C alcohol use screening    01/21/2023    2:25 PM  Alcohol Use Disorder Test (AUDIT)  1. How often do you have a drink containing alcohol? 1  2. How many drinks containing alcohol do you have on a typical day when you are drinking? 0  3. How often do you have six or more drinks on one occasion? 1  AUDIT-C Score 2   A score of 3 or more in women, and 4 or more in men indicates increased risk for alcohol abuse, EXCEPT if all of the points are from question 1   No results found for any visits on 02/22/23.  Assessment & Plan    Routine Health Maintenance and Physical Exam  Exercise Activities and Dietary recommendations  Goals   Healthy diet and daily exercise     Immunization History  Administered Date(s) Administered   PFIZER(Purple Top)SARS-COV-2 Vaccination 01/30/2020, 02/21/2020    Health Maintenance  Topic Date Due   DTaP/Tdap/Td (1 - Tdap) Never done   COVID-19 Vaccine (3 - Pfizer risk series) 03/20/2020   INFLUENZA VACCINE  06/13/2023   Hepatitis C Screening  Completed   HIV Screening  Completed   HPV VACCINES  Aged Out    Discussed health benefits of physical activity, and encouraged him to engage in regular exercise appropriate for his age and condition.  1. Annual physical exam UTD on dental, advised to see ophthalmology for eye exam Things to do to keep yourself  healthy  - Exercise at least 30-45 minutes a day, 3-4 days a week.  - Eat a low-fat diet with lots of fruits and vegetables, up to 7-9 servings per day.  - Seatbelts can save your life. Wear them always.  - Smoke detectors on every level of your home, check batteries every year.  - Eye Doctor - have an eye exam every 1-2 years  - Safe sex - if you may be exposed to STDs, use a condom.  - Alcohol -  If you drink, do it moderately, less than 2 drinks per day.  - Health Care Power of Attorney. Choose someone to Porter for you if you are not able.  - Depression is common in our stressful world.If you're feeling down or losing interest in things you normally enjoy, please come in for a visit.  - Violence - If anyone is threatening or hurting you, please call immediately.   2. Gastroesophageal reflux disease without esophagitis Continue PPI , advised to increase a dose. Elevate the head of the bed 6-8 inches, avoid recumbency for 3 hours after eating, avoid triggger food  Advised Weight loss. - omeprazole (PRILOSEC) 40 MG capsule; Take 1 capsule (40 mg total) by mouth daily.  Dispense: 60 capsule; Refill: 1 PPI dose was increased to 40mg  for a better GERD control Will FU  3. Obesity (BMI 30-39.9) BMI 36.0 Weight loss of 5% of pt's current weight via healthy diet and da.ily exercise encouraged. Continue low carb low fat diet  and regular exercise  4. Hypertension Chronic and unstable His BP at home out of normal range Advised to start antihypertensive medication - hydrochlorothiazide (MICROZIDE) 12.5 MG capsule; Take 1 capsule (12.5 mg total) by mouth daily.  Dispense: 30 capsule; Refill: 0 Advised low sodium intake and regular exercise. Needs to schedule an eye exam, to leave a message, spanish speaking  5. 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     The patient was advised to call back or seek an in-person evaluation if the symptoms worsen or if the condition fails to improve as anticipated.  I discussed the assessment and treatment plan with the patient. The patient was provided an opportunity to ask questions and all were answered. The patient agreed with the plan and demonstrated an understanding of the instructions.  I, Peter Lat, PA-C have reviewed all documentation for this visit. The documentation on 02/22/23  for the exam, diagnosis, procedures, and orders are all accurate and complete.  Peter Porter, [facility], MMS [facility] [phone] (phone) [phone] (fax)   [facility]

## 2023-02-22 ENCOUNTER — Encounter: Payer: Self-pay | Admitting: Physician Assistant

## 2023-02-22 ENCOUNTER — Ambulatory Visit: Payer: BC Managed Care – PPO | Admitting: Physician Assistant

## 2023-02-22 VITALS — BP 126/92 | HR 75 | Temp 98.0°F | Resp 16 | Wt 244.0 lb

## 2023-02-22 DIAGNOSIS — E669 Obesity, unspecified: Secondary | ICD-10-CM | POA: Diagnosis not present

## 2023-02-22 DIAGNOSIS — I10 Essential (primary) hypertension: Secondary | ICD-10-CM

## 2023-02-22 DIAGNOSIS — Z0000 Encounter for general adult medical examination without abnormal findings: Secondary | ICD-10-CM

## 2023-02-22 DIAGNOSIS — D3190 Benign neoplasm of unspecified part of unspecified eye: Secondary | ICD-10-CM | POA: Diagnosis not present

## 2023-02-22 DIAGNOSIS — R030 Elevated blood-pressure reading, without diagnosis of hypertension: Secondary | ICD-10-CM

## 2023-02-22 DIAGNOSIS — K219 Gastro-esophageal reflux disease without esophagitis: Secondary | ICD-10-CM

## 2023-02-22 MED ORDER — OMEPRAZOLE 40 MG PO CPDR: 40.0000 mg | DELAYED_RELEASE_CAPSULE | Freq: Every day | ORAL | 1 refills | Status: DC

## 2023-02-22 MED ORDER — HYDROCHLOROTHIAZIDE 12.5 MG PO CAPS: 12.5000 mg | ORAL_CAPSULE | Freq: Every day | ORAL | 0 refills | Status: DC

## 2023-02-24 DIAGNOSIS — Z1211 Encounter for screening for malignant neoplasm of colon: Secondary | ICD-10-CM | POA: Insufficient documentation

## 2023-02-24 DIAGNOSIS — Z0000 Encounter for general adult medical examination without abnormal findings: Secondary | ICD-10-CM | POA: Insufficient documentation

## 2023-03-08 DIAGNOSIS — H11153 Pinguecula, bilateral: Secondary | ICD-10-CM | POA: Diagnosis not present

## 2023-03-08 DIAGNOSIS — H11132 Conjunctival pigmentations, left eye: Secondary | ICD-10-CM | POA: Diagnosis not present

## 2023-03-23 ENCOUNTER — Other Ambulatory Visit: Payer: Self-pay | Admitting: Physician Assistant

## 2023-03-23 DIAGNOSIS — I10 Essential (primary) hypertension: Secondary | ICD-10-CM

## 2023-03-25 NOTE — Telephone Encounter (Signed)
Appointment 5/24 Requested Prescriptions  Pending Prescriptions Disp Refills   hydrochlorothiazide (MICROZIDE) 12.5 MG capsule [Pharmacy Med Name: HYDROCHLOROTHIAZIDE 12.5 MG CP] 30 capsule 0    Sig: TAKE 1 CAPSULE BY MOUTH EVERY DAY     Cardiovascular: Diuretics - Thiazide Failed - 03/23/2023  1:15 AM      Failed - Last BP in normal range    BP Readings from Last 1 Encounters:  02/22/23 (!) 126/92         Passed - Cr in normal range and within 180 days    Creatinine, Ser  Date Value Ref Range Status  01/25/2023 0.81 0.76 - 1.27 mg/dL Final         Passed - K in normal range and within 180 days    Potassium  Date Value Ref Range Status  01/25/2023 4.5 3.5 - 5.2 mmol/L Final         Passed - Na in normal range and within 180 days    Sodium  Date Value Ref Range Status  01/25/2023 137 134 - 144 mmol/L Final         Passed - Valid encounter within last 6 months    Recent Outpatient Visits           1 month ago Annual physical exam   [facility] [place], [place], PA-C   2 months ago Gastroesophageal reflux disease without esophagitis   [facility] [place], [place], PA-C       Future Appointments             In 1 week Debera Lat, PA-C [facility]

## 2023-04-05 ENCOUNTER — Ambulatory Visit: Payer: BC Managed Care – PPO | Admitting: Physician Assistant

## 2023-04-05 VITALS — BP 134/105 | HR 97 | Ht 69.0 in | Wt 237.8 lb

## 2023-04-05 DIAGNOSIS — I10 Essential (primary) hypertension: Secondary | ICD-10-CM

## 2023-04-05 DIAGNOSIS — K219 Gastro-esophageal reflux disease without esophagitis: Secondary | ICD-10-CM

## 2023-04-05 DIAGNOSIS — E669 Obesity, unspecified: Secondary | ICD-10-CM | POA: Diagnosis not present

## 2023-04-05 MED ORDER — OMEPRAZOLE 20 MG PO CPDR: 20.0000 mg | DELAYED_RELEASE_CAPSULE | Freq: Two times a day (BID) | ORAL | 1 refills | Status: DC

## 2023-04-05 MED ORDER — HYDROCHLOROTHIAZIDE 12.5 MG PO CAPS: 12.5000 mg | ORAL_CAPSULE | Freq: Two times a day (BID) | ORAL | 1 refills | Status: DC

## 2023-04-05 NOTE — Progress Notes (Unsigned)
I,Peter Porter,acting as a Neurosurgeon for [employer], PA-C.,have documented all relevant documentation on the behalf of Peter Lat, PA-C,as directed by  [employer], PA-C while in the presence of [employer], PA-C.   Established patient visit   Patient: Peter Porter   DOB: 21-Apr-1976   47 y.o. Male  MRN: [id number] Visit Date: 04/05/2023  Today's healthcare provider: Debera Lat, PA-C   No chief complaint on file.  Subjective    HPI  -Patient accompanied by interpreter Peter Porter -Patient also reports his acid reflux continues associated with tingling, weakness, and diarrhea Hypertension, follow-up  BP Readings from Last 3 Encounters:  02/22/23 (!) 126/92  01/21/23 (!) 138/108  02/27/21 (!) 150/123   Wt Readings from Last 3 Encounters:  02/22/23 244 lb (110.7 kg)  01/21/23 244 lb (110.7 kg)  02/27/21 240 lb (108.9 kg)     He was last seen for hypertension 1 months ago.  BP at that visit was 126/92. Management since that visit includes start antihypertensive medication - hydrochlorothiazide (Peter Porter) 12.5 MG capsule.  He reports excellent compliance with treatment. He is not having side effects. He is following a Regular diet. He is exercising. He does not smoke.  Use of agents associated with hypertension: none.   Outside blood pressures are normal in the morning but in the afternoons it increases; 138-158/5's-70's. Symptoms: No chest pain No chest pressure  No palpitations No syncope  No dyspnea No orthopnea  No paroxysmal nocturnal dyspnea No lower extremity edema   Pertinent labs Lab Results  Component Value Date   CHOL 145 01/25/2023   HDL 31 (L) 01/25/2023   LDLCALC 80 01/25/2023   TRIG 197 (H) 01/25/2023   CHOLHDL 4.7 01/25/2023   Lab Results  Component Value Date   NA 137 01/25/2023   K 4.5 01/25/2023   CREATININE 0.81 01/25/2023   EGFR 111 01/25/2023   GLUCOSE 93 01/25/2023   TSH 2.000 01/25/2023     The 10-year ASCVD risk  score (Arnett DK, et al., 2019) is: 2.4% ---------------------------------------------------------------------------------------------------    Medications: Outpatient Medications Prior to Visit  Medication Sig   hydrochlorothiazide (Peter Porter) 12.5 MG capsule TAKE 1 CAPSULE BY MOUTH EVERY DAY   omeprazole (Peter Porter) 40 MG capsule Take 1 capsule (40 mg total) by mouth daily.   No facility-administered medications prior to visit.    Review of Systems  All other systems reviewed and are negative.  {Labs  Heme  Chem  Endocrine  Serology  Results Review (optional):23779}   Objective    There were no vitals taken for this visit. {Show previous vital signs (optional):23777}  Physical Exam Vitals reviewed.  Constitutional:      General: He is not in acute distress.    Appearance: Normal appearance. He is not diaphoretic.  HENT:     Head: Normocephalic and atraumatic.  Eyes:     General: No scleral icterus.    Conjunctiva/sclera: Conjunctivae normal.  Cardiovascular:     Rate and Rhythm: Normal rate and regular rhythm.     Pulses: Normal pulses.     Heart sounds: Normal heart sounds. No murmur heard. Pulmonary:     Effort: Pulmonary effort is normal. No respiratory distress.     Breath sounds: Normal breath sounds. No wheezing or rhonchi.  Musculoskeletal:     Cervical back: Neck supple.     Right lower leg: No edema.     Left lower leg: No edema.  Lymphadenopathy:     Cervical: No cervical  adenopathy.  Skin:    General: Skin is warm and dry.     Findings: No rash.  Neurological:     Mental Status: He is alert and oriented to person, place, and time. Mental status is at baseline.  Psychiatric:        Mood and Affect: Mood normal.        Behavior: Behavior normal.     ***  No results found for any visits on 04/05/23.  Assessment & Plan     ***  No follow-ups on file.      The patient was advised to call back or seek an in-person evaluation if the symptoms  worsen or if the condition fails to improve as anticipated.  I discussed the assessment and treatment plan with the patient. The patient was provided an opportunity to ask questions and all were answered. The patient agreed with the plan and demonstrated an understanding of the instructions.  I, Peter Lat, PA-C have reviewed all documentation for this visit. The documentation on  04/05/23  for the exam, diagnosis, procedures, and orders are all accurate and complete.  Peter Porter, [facility], MMS [facility] [phone] (phone) [phone] (fax)   [facility]

## 2023-04-07 ENCOUNTER — Encounter: Payer: Self-pay | Admitting: Physician Assistant

## 2023-05-09 ENCOUNTER — Ambulatory Visit: Payer: BC Managed Care – PPO | Admitting: Physician Assistant

## 2023-05-09 NOTE — Progress Notes (Deleted)
Established patient visit  Patient: Peter Porter   DOB: [birth year]   47 y.o. Male  MRN: [id number] Visit Date: 05/09/2023  Today's healthcare provider: Debera Lat, PA-C   No chief complaint on file.  Subjective    HPI  *** Discussed the use of AI scribe software for clinical note transcription with the patient, who gave verbal consent to proceed.  History of Present Illness               04/05/2023    3:52 PM 01/21/2023    2:25 PM  Depression screen PHQ 2/9  Decreased Interest 0 1  Down, Depressed, Hopeless 0 0  PHQ - 2 Score 0 1  Altered sleeping 0 1  Tired, decreased energy 0 1  Change in appetite 0 0  Feeling bad or failure about yourself  0 0  Trouble concentrating 0 0  Moving slowly or fidgety/restless 0 0  Suicidal thoughts 0 0  PHQ-9 Score 0 3  Difficult doing work/chores  Not difficult at all       No data to display          Medications: Outpatient Medications Prior to Visit  Medication Sig   hydrochlorothiazide (MICROZIDE) 12.5 MG capsule Take 1 capsule (12.5 mg total) by mouth 2 (two) times daily.   omeprazole (PRILOSEC) 20 MG capsule Take 1 capsule (20 mg total) by mouth 2 (two) times daily before a meal.   No facility-administered medications prior to visit.    Review of Systems  Constitutional:  Positive for appetite change.   Except see HPI   {Labs  Heme  Chem  Endocrine  Serology  Results Review (optional):23779}   Objective    There were no vitals taken for this visit. {Show previous vital signs (optional):23777}  Physical Exam   No results found for any visits on 05/09/23.  Assessment & Plan    *** Assessment and Plan           Expand All Collapse All       I,Sha'taria Tyson,acting as a scribe for [employer], PA-C.,have documented all relevant documentation on the behalf of Debera Lat, PA-C,as directed by  [employer], PA-C while in the presence of [employer], PA-C.    Established patient visit      Patient: Peter Porter   DOB: 06/09/1976   47 y.o. Male  MRN: [id number] Visit Date: 04/05/2023   Today's healthcare provider: Debera Lat, PA-C       Chief Complaint  Patient presents with   Medical Management of Chronic Issues      Subjective     HPI  -Patient accompanied by interpreter [name] -Patient also reports his acid reflux continues associated with tingling, weakness, and diarrhea He reports that medication he took for a 2 week trial was very helpful but his current medication was not. Per chart review, he has been taking omeprazole for a trial and currently.   Hypertension, follow-up      BP Readings from Last 3 Encounters:  04/05/23 (!) 134/105  02/22/23 (!) 126/92  01/21/23 (!) 138/108      Wt Readings from Last 3 Encounters:  04/05/23 237 lb 12.8 oz (107.9 kg)  02/22/23 244 lb (110.7 kg)  01/21/23 244 lb (110.7 kg)      He was last seen for hypertension 1 months ago.  BP at that visit was 126/92. Management since that visit includes start antihypertensive medication - hydrochlorothiazide (MICROZIDE) 12.5 MG capsule. However, pt was out of  medication for the past 2 weeks. He is not having side effects. He is following a Regular diet. He is exercising. He does not smoke.   Use of agents associated with hypertension: none.    Outside blood pressures are normal in the morning but in the afternoons it increases; 138-158/5's-70's. Symptoms: No chest pain No chest pressure  No palpitations No syncope  No dyspnea No orthopnea  No paroxysmal nocturnal dyspnea No lower extremity edema    Pertinent labs Recent Labs       Lab Results  Component Value Date    CHOL 145 01/25/2023    HDL 31 (L) 01/25/2023    LDLCALC 80 01/25/2023    TRIG 197 (H) 01/25/2023    CHOLHDL 4.7 01/25/2023     Recent Labs       Lab Results  Component Value Date    NA 137 01/25/2023    K 4.5 01/25/2023    CREATININE 0.81 01/25/2023    EGFR 111 01/25/2023    GLUCOSE 93  01/25/2023    TSH 2.000 01/25/2023        The 10-year ASCVD risk score (Arnett DK, et al., 2019) is: 2.7% ---------------------------------------------------------------------------------------------------      Medications: Show/hide medication list      Outpatient Medications Prior to Visit  Medication Sig   [DISCONTINUED] hydrochlorothiazide (MICROZIDE) 12.5 MG capsule TAKE 1 CAPSULE BY MOUTH EVERY DAY   [DISCONTINUED] omeprazole (PRILOSEC) 40 MG capsule Take 1 capsule (40 mg total) by mouth daily.    No facility-administered medications prior to visit.        Review of Systems  All other systems reviewed and are negative. Except see HPI           Objective     BP (!) 134/105 (BP Location: Left Arm, Patient Position: Sitting, Cuff Size: Large)   Pulse 97   Ht 5\' 9"  (1.753 m)   Wt 237 lb 12.8 oz (107.9 kg)   SpO2 98%   BMI 35.12 kg/m      Physical Exam Vitals reviewed.  Constitutional:      General: He is not in acute distress.    Appearance: Normal appearance. He is not diaphoretic.  HENT:     Head: Normocephalic and atraumatic.  Eyes:     General: No scleral icterus.    Conjunctiva/sclera: Conjunctivae normal.  Cardiovascular:     Rate and Rhythm: Normal rate and regular rhythm.     Pulses: Normal pulses.     Heart sounds: Normal heart sounds. No murmur heard. Pulmonary:     Effort: Pulmonary effort is normal. No respiratory distress.     Breath sounds: Normal breath sounds. No wheezing or rhonchi.  Musculoskeletal:     Cervical back: Neck supple.     Right lower leg: No edema.     Left lower leg: No edema.  Lymphadenopathy:     Cervical: No cervical adenopathy.  Skin:    General: Skin is warm and dry.     Findings: No rash.  Neurological:     Mental Status: He is alert and oriented to person, place, and time. Mental status is at baseline.  Psychiatric:        Mood and Affect: Mood normal.        Behavior: Behavior normal.      No results  found for any visits on 04/05/23.     Assessment & Plan      1. Gastroesophageal reflux disease without esophagitis Chronic and  unstable Advised to take : - omeprazole (PRILOSEC) 20 MG capsule; Take 1 capsule (20 mg total) by mouth 2 (two) times daily before a meal.  Dispense: 60 capsule; Refill: 1 Having tingling, advised to take vitamin B12 or multivitamins, could be related to taking PPI? Having diarrhea, will revisit, could be due to IBS. Will advise a diet for IBS. Continue lifestyle modification; avoid trigger food, weight loss,  Elevate the head of the bed 6-8 inches, avoid recumbency for 3 hours after eating   Will reassess in 4-6 weeks Pt requested a referral to GI. A referral was placed.   2. Obesity (BMI 30-39.9) Chronic and stable BMI 35.12 Weight loss of 5% of pt's current weight via healthy diet and daily exercise encouraged.     3. Primary hypertension Chronic and previously unstable Continue taking - hydrochlorothiazide (MICROZIDE) 12.5 MG capsule; Take 1 capsule (12.5 mg total) by mouth 2 (two) times daily.  Dispense: 60 capsule; Refill: 1 However, advised to measure his BP at home and bring the records Will reassess in 4-6 weeks        No follow-ups on file.     The patient was advised to call back or seek an in-person evaluation if the symptoms worsen or if the condition fails to improve as anticipated.  I discussed the assessment and treatment plan with the patient. The patient was provided an opportunity to ask questions and all were answered. The patient agreed with the plan and demonstrated an understanding of the instructions.  I, Debera Lat, PA-C have reviewed all documentation for this visit. The documentation on 05/09/23 for the exam, diagnosis, procedures, and orders are all accurate and complete.  Debera Lat, [facility], MMS [facility] [phone] (phone) [phone] (fax)  [facility]

## 2023-05-16 NOTE — Progress Notes (Signed)
Established patient visit  Patient: Peter Porter   DOB: [birth year]   47 y.o. Male  MRN: [id number] Visit Date: 05/17/2023  Today's healthcare provider: Debera Lat, PA-C   Chief Complaint  Patient presents with   Medical Management of Chronic Issues       Subjective     HPI     Medical Management of Chronic Issues    Additional comments: Past last seen 05/24. Taking medications as prescribed. Patient checking BP daily 2xs daily. Reports symptoms of syncope at times.  Patient reports acid reflux is better since starting rx.  Patient also reports tingling on left side of body at times (mainly legs) and associated with nausea. Last episode was yesterday.          Comments   Interpreter for visit Maurico ID# [id number]      Last edited by Acey Lav, CMA on 05/17/2023  3:52 PM.      Discussed the use of AI scribe software for clinical note transcription with the patient, who gave verbal consent to proceed.  History of Present Illness   The patient, with a history of hypertension and abdominal pain, presents with concerns about his blood pressure. He has been monitoring his blood pressure at home, but there seems to be some confusion about the readings. The patient also mentions a tingling sensation in his foot, which he attributes to a past issue with his sciatic nerve. He has been managing this with exercises recommended by a doctor and has not had a steroid shot for this issue in about seven years. The patient has been taking medication for his blood pressure once daily and has been advised to increase this to twice daily. The patient works six days a week, which he finds stressful.           04/05/2023    3:52 PM 01/21/2023    2:25 PM  Depression screen PHQ 2/9  Decreased Interest 0 1  Down, Depressed, Hopeless 0 0  PHQ - 2 Score 0 1  Altered sleeping 0 1  Tired, decreased energy 0 1  Change in appetite 0 0  Feeling bad or failure about yourself  0 0  Trouble  concentrating 0 0  Moving slowly or fidgety/restless 0 0  Suicidal thoughts 0 0  PHQ-9 Score 0 3  Difficult doing work/chores  Not difficult at all       No data to display          Medications: Outpatient Medications Prior to Visit  Medication Sig   [DISCONTINUED] hydrochlorothiazide (MICROZIDE) 12.5 MG capsule Take 1 capsule (12.5 mg total) by mouth 2 (two) times daily.   [DISCONTINUED] omeprazole (PRILOSEC) 20 MG capsule Take 1 capsule (20 mg total) by mouth 2 (two) times daily before a meal.   No facility-administered medications prior to visit.    Review of Systems  All other systems reviewed and are negative.  Except see HPI      Objective    BP (!) 124/93 (BP Location: Right Arm, Patient Position: Sitting, Cuff Size: Large)   Pulse 70   Wt 239 lb 6.4 oz (108.6 kg)   SpO2 100%   BMI 35.35 kg/m    Physical Exam Vitals reviewed.  Constitutional:      General: He is not in acute distress.    Appearance: Normal appearance. He is not diaphoretic.  HENT:     Head: Normocephalic and atraumatic.  Eyes:     General: No scleral icterus.  Conjunctiva/sclera: Conjunctivae normal.  Cardiovascular:     Rate and Rhythm: Normal rate and regular rhythm.     Pulses: Normal pulses.     Heart sounds: Normal heart sounds. No murmur heard. Pulmonary:     Effort: Pulmonary effort is normal. No respiratory distress.     Breath sounds: Normal breath sounds. No wheezing or rhonchi.  Musculoskeletal:     Cervical back: Neck supple.     Right lower leg: No edema.     Left lower leg: No edema.  Lymphadenopathy:     Cervical: No cervical adenopathy.  Skin:    General: Skin is warm and dry.     Findings: No rash.  Neurological:     Mental Status: He is alert and oriented to person, place, and time. Mental status is at baseline.  Psychiatric:        Mood and Affect: Mood normal.        Behavior: Behavior normal.      No results found for any visits on 05/17/23.   Assessment & Plan        Hypertension: Blood pressure readings inconsistent, possibly due to incorrect measurement technique. Patient currently on once daily antihypertensive medication, but blood pressure remains elevated. -Increase antihypertensive medication to twice daily (morning and evening). Sent an additional refills as pt will be out of town for a mo or two. -Advise patient to monitor blood pressure daily and maintain a record. -Provide patient with a diet plan for hypertension. -Check blood pressure at next visit. Pt instructions for low salt diet and DASH diet were provided.  Lower back pain and sciatica: Patient reports tingling sensation from hip to foot, possibly related to previous sciatica. Last steroid shot was seven years ago. -Advise patient to continue with recommended exercises. -Consider another steroid shot if symptoms persist. -Encourage patient to stretch before work daily. -Provide patient with resources for stretching exercises.  Obesity Chronic and improving BMI today was 35.35 Weight loss of 5% of pt's current weight via healthy diet and daily exercise encouraged.  GERD Improved. Continue taking current medication BID  General Health Maintenance: -Discuss colon cancer screening options at next visit. -Consider tetanus shot.  Follow-up in August 2024.     Return in about 7 weeks (around 07/05/2023) for chronic disease f/u.     The patient was advised to call back or seek an in-person evaluation if the symptoms worsen or if the condition fails to improve as anticipated.  I discussed the assessment and treatment plan with the patient. The patient was provided an opportunity to ask questions and all were answered. The patient agreed with the plan and demonstrated an understanding of the instructions.  I, Debera Lat, PA-C have reviewed all documentation for this visit. The documentation on  05/17/23  for the exam, diagnosis, procedures, and orders are all  accurate and complete.  Debera Lat, [facility], MMS [facility] [phone] (phone) [phone] (fax)  [facility]

## 2023-05-17 ENCOUNTER — Encounter: Payer: Self-pay | Admitting: Physician Assistant

## 2023-05-17 ENCOUNTER — Ambulatory Visit (INDEPENDENT_AMBULATORY_CARE_PROVIDER_SITE_OTHER): Payer: BC Managed Care – PPO | Admitting: Physician Assistant

## 2023-05-17 VITALS — BP 124/93 | HR 70 | Wt 239.4 lb

## 2023-05-17 DIAGNOSIS — E669 Obesity, unspecified: Secondary | ICD-10-CM | POA: Diagnosis not present

## 2023-05-17 DIAGNOSIS — M5442 Lumbago with sciatica, left side: Secondary | ICD-10-CM

## 2023-05-17 DIAGNOSIS — K219 Gastro-esophageal reflux disease without esophagitis: Secondary | ICD-10-CM

## 2023-05-17 DIAGNOSIS — I10 Essential (primary) hypertension: Secondary | ICD-10-CM | POA: Diagnosis not present

## 2023-05-17 DIAGNOSIS — G8929 Other chronic pain: Secondary | ICD-10-CM | POA: Insufficient documentation

## 2023-05-17 MED ORDER — OMEPRAZOLE 20 MG PO CPDR: 20.0000 mg | DELAYED_RELEASE_CAPSULE | Freq: Two times a day (BID) | ORAL | 1 refills | Status: DC

## 2023-05-17 MED ORDER — HYDROCHLOROTHIAZIDE 12.5 MG PO CAPS: 12.5000 mg | ORAL_CAPSULE | Freq: Two times a day (BID) | ORAL | 1 refills | Status: DC

## 2023-06-20 NOTE — Progress Notes (Deleted)
Established patient visit  Patient: Peter Porter   DOB: 1976-05-27   47 y.o. Male  MRN: [id number] Visit Date: 06/21/2023  Today's healthcare provider: Debera Lat, PA-C   No chief complaint on file.  Subjective    HPI  *** Discussed the use of AI scribe software for clinical note transcription with the patient, who gave verbal consent to proceed.  History of Present Illness               04/05/2023    3:52 PM 01/21/2023    2:25 PM  Depression screen PHQ 2/9  Decreased Interest 0 1  Down, Depressed, Hopeless 0 0  PHQ - 2 Score 0 1  Altered sleeping 0 1  Tired, decreased energy 0 1  Change in appetite 0 0  Feeling bad or failure about yourself  0 0  Trouble concentrating 0 0  Moving slowly or fidgety/restless 0 0  Suicidal thoughts 0 0  PHQ-9 Score 0 3  Difficult doing work/chores  Not difficult at all       No data to display          Medications: Outpatient Medications Prior to Visit  Medication Sig   hydrochlorothiazide (MICROZIDE) 12.5 MG capsule Take 1 capsule (12.5 mg total) by mouth 2 (two) times daily.   omeprazole (PRILOSEC) 20 MG capsule Take 1 capsule (20 mg total) by mouth 2 (two) times daily before a meal.   No facility-administered medications prior to visit.    Review of Systems  All other systems reviewed and are negative.  Except see HPI   {Insert previous labs (optional):23779} {See past labs  Heme  Chem  Endocrine  Serology  Results Review (optional):1}   Objective    There were no vitals taken for this visit. {Insert last BP/Wt (optional):23777}{See vitals history (optional):1}   Physical Exam Vitals reviewed.  Constitutional:      General: He is not in acute distress.    Appearance: Normal appearance. He is not diaphoretic.  HENT:     Head: Normocephalic and atraumatic.  Eyes:     General: No scleral icterus.    Conjunctiva/sclera: Conjunctivae normal.  Cardiovascular:     Rate and Rhythm: Normal rate and  regular rhythm.     Pulses: Normal pulses.     Heart sounds: Normal heart sounds. No murmur heard. Pulmonary:     Effort: Pulmonary effort is normal. No respiratory distress.     Breath sounds: Normal breath sounds. No wheezing or rhonchi.  Musculoskeletal:     Cervical back: Neck supple.     Right lower leg: No edema.     Left lower leg: No edema.  Lymphadenopathy:     Cervical: No cervical adenopathy.  Skin:    General: Skin is warm and dry.     Findings: No rash.  Neurological:     Mental Status: He is alert and oriented to person, place, and time. Mental status is at baseline.  Psychiatric:        Mood and Affect: Mood normal.        Behavior: Behavior normal.      No results found for any visits on 06/21/23.  Assessment & Plan            No follow-ups on file.     The patient was advised to call back or seek an in-person evaluation if the symptoms worsen or if the condition fails to improve as anticipated.  I discussed the assessment and treatment  plan with the patient. The patient was provided an opportunity to ask questions and all were answered. The patient agreed with the plan and demonstrated an understanding of the instructions.  I, Debera Lat, PA-C have reviewed all documentation for this visit. The documentation on  /9/24@CurDate @  for the exam, diagnosis, procedures, and orders are all accurate and complete.  Debera Lat, [facility], MMS [facility] [phone] (phone) [phone] (fax)  [facility]

## 2023-06-21 ENCOUNTER — Ambulatory Visit: Payer: BC Managed Care – PPO | Admitting: Physician Assistant

## 2023-06-21 DIAGNOSIS — I10 Essential (primary) hypertension: Secondary | ICD-10-CM

## 2023-06-21 DIAGNOSIS — G8929 Other chronic pain: Secondary | ICD-10-CM

## 2023-06-21 DIAGNOSIS — E669 Obesity, unspecified: Secondary | ICD-10-CM

## 2023-06-27 NOTE — Progress Notes (Signed)
Celso Amy, PA-C [address]  [address]  [place], [place] [address]  Main: [phone]  Fax: [phone]   Gastroenterology Consultation  Referring Provider:     Debera Lat, PA-C Primary Care Physician:  Debera Lat, PA-C Primary Gastroenterologist:  Celso Amy, PA-C / Dr. Wyline Mood   Reason for Consultation:     Epigastric Pain, Colon Cancer Screen        HPI:   Peter Porter is a 47 y.o. y/o male referred for consultation & management  by Debera Lat, PA-C.  We used Spanish interpreter today: Cammy Copa [phone].  Epigastric Pain x 2 years. - Persistent on omeprazole (PRILOSEC) 20 MG capsule Twice daily - No previous GI Eval or EGD or H. Pylori test. - Worse after drinking Orange Juice. -Denies NSAID use.  Has mild nausea, but no vomiting.  Occurs daily in AM and PM. -No tobacco use.  Drinks 3 drinks of alcohol per week. -No previous abdominal surgeries. -Labs 01/2023: Normal CBC, CMP, TSH.  Negative HIV, HCV.  Mild elevated ALT 71, Triglycerides 197.  Normal Hemoglobin 14.6g.  Colon Cancer Screening - No previous Colonoscopy - No Family History of Colon or GI cancers - Occasional episode of Diarrhea.  Denies constipation, Rectal bleeding, Melena or weight loss.  Past Medical History:  Diagnosis Date   Elevated blood pressure    NO MEDS   GERD (gastroesophageal reflux disease)    Hypertension    Neuromuscular disorder (HCC)    Sleep apnea     Past Surgical History:  Procedure Laterality Date   NO PAST SURGERIES      Prior to Admission medications   Medication Sig Start Date End Date Taking? Authorizing Provider  hydrochlorothiazide (MICROZIDE) 12.5 MG capsule Take 1 capsule (12.5 mg total) by mouth 2 (two) times daily. 05/17/23   Debera Lat, PA-C  omeprazole (PRILOSEC) 20 MG capsule Take 1 capsule (20 mg total) by mouth 2 (two) times daily before a meal. 05/17/23   Debera Lat, PA-C    Family History  Problem Relation Age of Onset    Osteoporosis Mother    Diabetes Father    Cancer Paternal Grandmother      Social History   Tobacco Use   Smoking status: Never   Smokeless tobacco: Never  Vaping Use   Vaping status: Never Used  Substance Use Topics   Alcohol use: Yes    Alcohol/week: 3.0 standard drinks of alcohol    Types: 1 Glasses of wine, 2 Cans of beer per week   Drug use: No    Allergies as of 06/28/2023   (No Known Allergies)    Review of Systems:    All systems reviewed and negative except where noted in HPI.   Physical Exam:  BP (!) 130/91   Pulse 76   Temp 98.2 F (36.8 C)   Ht 5\' 9"  (1.753 m)   Wt 234 lb (106.1 kg)   BMI 34.56 kg/m  No LMP for male patient.  General:   Alert,  Well-developed, well-nourished, pleasant and cooperative in NAD Lungs:  Respirations even and unlabored.  Clear throughout to auscultation.   No wheezes, crackles, or rhonchi. No acute distress. Heart:  Regular rate and rhythm; no murmurs, clicks, rubs, or gallops. Abdomen:  Normal bowel sounds.  No bruits.  Soft, and non-distended without masses, hepatosplenomegaly or hernias noted.  No Tenderness.  No guarding or rebound tenderness.    Neurologic:  Alert and oriented x3;  grossly normal neurologically.  Psych:  Alert and cooperative. Normal mood and affect.  Imaging Studies: No results found.  Assessment and Plan:   Junius Vien is a 47 y.o. y/o male has been referred for:  Epigastric Pain, persistent on PPI Scheduling EGD - Consider Biopsies for H. Pylori. I discussed risks of EGD with patient to include risk of bleeding, perforation, and risk of sedation.  Patient expressed understanding and agrees to proceed with EGD.  Continue Omeprazole 20mg  BID for now. Rec. Avoid coffee, alcohol, sodas, peppermint, citrus fruits, and spicey foods.   Avoid NSAIDS.  2.   Colon Cancer Screening  Scheduling Colonoscopy I discussed risks of colonoscopy with patient to include risk of bleeding, colon perforation, and  risk of sedation.  Patient expressed understanding and agrees to proceed with colonoscopy.   Follow up 4 weeks after EGD / Colon with TG.  Celso Amy, PA-C

## 2023-06-28 ENCOUNTER — Encounter: Payer: Self-pay | Admitting: Physician Assistant

## 2023-06-28 ENCOUNTER — Ambulatory Visit: Payer: BC Managed Care – PPO | Admitting: Physician Assistant

## 2023-06-28 VITALS — BP 142/92 | HR 76 | Temp 98.2°F | Ht 69.0 in | Wt 234.0 lb

## 2023-06-28 DIAGNOSIS — R1013 Epigastric pain: Secondary | ICD-10-CM | POA: Diagnosis not present

## 2023-06-28 DIAGNOSIS — Z1211 Encounter for screening for malignant neoplasm of colon: Secondary | ICD-10-CM

## 2023-06-28 MED ORDER — PEG 3350-KCL-NA BICARB-NACL 420 G PO SOLR: 4000.0000 mL | Freq: Once | ORAL | 0 refills | Status: AC

## 2023-06-28 NOTE — Patient Instructions (Signed)
I Recommend Avoid coffee, alcohol, sodas, peppermint, citrus fruits, and spicey foods.  Also Avoid NSAIDS such as Ibuprofen, Advil, Aleve, Naproxen.

## 2023-07-17 ENCOUNTER — Other Ambulatory Visit: Payer: Self-pay | Admitting: Physician Assistant

## 2023-07-17 DIAGNOSIS — K219 Gastro-esophageal reflux disease without esophagitis: Secondary | ICD-10-CM

## 2023-07-17 DIAGNOSIS — I10 Essential (primary) hypertension: Secondary | ICD-10-CM

## 2023-08-23 ENCOUNTER — Ambulatory Visit
Admission: RE | Admit: 2023-08-23 | Discharge: 2023-08-23 | Disposition: A | Discharge status: Home / Self Care | Payer: BC Managed Care – PPO | Attending: Gastroenterology | Admitting: Gastroenterology

## 2023-08-23 ENCOUNTER — Ambulatory Visit: Payer: BC Managed Care – PPO | Admitting: Anesthesiology

## 2023-08-23 ENCOUNTER — Encounter
Admission: RE | Disposition: A | Discharge status: Home / Self Care | Payer: Self-pay | Source: Home / Self Care | Attending: Gastroenterology

## 2023-08-23 DIAGNOSIS — Z1211 Encounter for screening for malignant neoplasm of colon: Secondary | ICD-10-CM

## 2023-08-23 DIAGNOSIS — R1013 Epigastric pain: Secondary | ICD-10-CM

## 2023-08-23 DIAGNOSIS — I10 Essential (primary) hypertension: Secondary | ICD-10-CM | POA: Insufficient documentation

## 2023-08-23 DIAGNOSIS — G4730 Sleep apnea, unspecified: Secondary | ICD-10-CM | POA: Diagnosis not present

## 2023-08-23 DIAGNOSIS — R109 Unspecified abdominal pain: Secondary | ICD-10-CM | POA: Diagnosis not present

## 2023-08-23 DIAGNOSIS — K219 Gastro-esophageal reflux disease without esophagitis: Secondary | ICD-10-CM | POA: Insufficient documentation

## 2023-08-23 HISTORY — PX: COLONOSCOPY WITH PROPOFOL: SHX5780

## 2023-08-23 HISTORY — PX: ESOPHAGOGASTRODUODENOSCOPY (EGD) WITH PROPOFOL: SHX5813

## 2023-08-23 SURGERY — ESOPHAGOGASTRODUODENOSCOPY (EGD) WITH PROPOFOL
Anesthesia: General

## 2023-08-23 MED ORDER — PROPOFOL 10 MG/ML IV BOLUS
INTRAVENOUS | Status: DC | PRN
  Administered 2023-08-23: 70 mg via INTRAVENOUS

## 2023-08-23 MED ORDER — LIDOCAINE HCL (CARDIAC) PF 100 MG/5ML IV SOSY
PREFILLED_SYRINGE | INTRAVENOUS | Status: DC | PRN
  Administered 2023-08-23: 60 mg via INTRAVENOUS

## 2023-08-23 MED ORDER — SODIUM CHLORIDE 0.9 % IV SOLN
INTRAVENOUS | Status: DC
  Administered 2023-08-23: 20 mL/h via INTRAVENOUS

## 2023-08-23 MED ORDER — DEXMEDETOMIDINE HCL IN NACL 80 MCG/20ML IV SOLN
INTRAVENOUS | Status: DC | PRN
  Administered 2023-08-23: 12 ug via INTRAVENOUS

## 2023-08-23 MED ORDER — PROPOFOL 500 MG/50ML IV EMUL
INTRAVENOUS | Status: DC | PRN
Start: 2023-08-23 — End: 2023-08-23
  Administered 2023-08-23: 140 ug/kg/min via INTRAVENOUS

## 2023-08-23 NOTE — H&P (Signed)
     Wyline Mood, MD [address], [address], [place], [place], [address] [address], [place], [place], [address] Phone: [phone]  Fax: [phone]  Primary Care Physician:  Debera Lat, PA-C   Pre-Procedure History & Physical: HPI:  Peter Porter is a 47 y.o. male is here for an endoscopy and colonoscopy    Past Medical History:  Diagnosis Date   Elevated blood pressure    NO MEDS   GERD (gastroesophageal reflux disease)    Hypertension    Neuromuscular disorder (HCC)    Sleep apnea     Past Surgical History:  Procedure Laterality Date   NO PAST SURGERIES      Prior to Admission medications   Medication Sig Start Date End Date Taking? Authorizing Provider  hydrochlorothiazide (MICROZIDE) 12.5 MG capsule TAKE 1 CAPSULE (12.5 MG TOTAL) BY MOUTH TWICE A DAY 07/18/23   Ostwalt, Janna, PA-C  omeprazole (PRILOSEC) 20 MG capsule TAKE 1 CAPSULE (20 MG TOTAL) BY MOUTH 2 (TWO) TIMES DAILY BEFORE A MEAL. 07/18/23   Ostwalt, Edmon Crape, PA-C    Allergies as of 06/28/2023   (No Known Allergies)    Family History  Problem Relation Age of Onset   Osteoporosis Mother    Diabetes Father    Cancer Paternal Grandmother     Social History   Socioeconomic History   Marital status: Married    Spouse name: Not on file   Number of children: Not on file   Years of education: Not on file   Highest education level: Not on file  Occupational History   Not on file  Tobacco Use   Smoking status: Never   Smokeless tobacco: Never  Vaping Use   Vaping status: Never Used  Substance and Sexual Activity   Alcohol use: Yes    Alcohol/week: 3.0 standard drinks of alcohol    Types: 1 Glasses of wine, 2 Cans of beer per week   Drug use: No   Sexual activity: Not on file  Other Topics Concern   Not on file  Social History Narrative   Not on file   Social Determinants of Health   Financial Resource Strain: Not on file  Food Insecurity: Not on file  Transportation  Needs: Not on file  Physical Activity: Not on file  Stress: Not on file  Social Connections: Not on file  Intimate Partner Violence: Not on file    Review of Systems: See HPI, otherwise negative ROS  Physical Exam: There were no vitals taken for this visit. General:   Alert,  pleasant and cooperative in NAD Head:  Normocephalic and atraumatic. Neck:  Supple; no masses or thyromegaly. Lungs:  Clear throughout to auscultation, normal respiratory effort.    Heart:  +S1, +S2, Regular rate and rhythm, No edema. Abdomen:  Soft, nontender and nondistended. Normal bowel sounds, without guarding, and without rebound.   Neurologic:  Alert and  oriented x4;  grossly normal neurologically.  Impression/Plan: Peter Porter is here for an endoscopy and colonoscopy  to be performed for  evaluation of abdominal pain and colon cancer screening     Risks, benefits, limitations, and alternatives regarding endoscopy have been reviewed with the patient.  Questions have been answered.  All parties agreeable.   Wyline Mood, MD  08/23/2023, 8:13 AM

## 2023-08-23 NOTE — Anesthesia Preprocedure Evaluation (Signed)
Anesthesia Evaluation  Patient identified by MRN, date of birth, ID band Patient awake    Reviewed: Allergy & Precautions, NPO status , Patient's Chart, lab work & pertinent test results  Airway Mallampati: II  TM Distance: >3 FB Neck ROM: Full    Dental  (+) Teeth Intact, Missing,    Pulmonary neg pulmonary ROS, sleep apnea    Pulmonary exam normal breath sounds clear to auscultation       Cardiovascular Exercise Tolerance: Good hypertension, Pt. on medications negative cardio ROS Normal cardiovascular exam Rhythm:Regular Rate:Normal     Neuro/Psych negative neurological ROS  negative psych ROS   GI/Hepatic negative GI ROS, Neg liver ROS,GERD  Medicated,,  Endo/Other  negative endocrine ROS    Renal/GU negative Renal ROS  negative genitourinary   Musculoskeletal negative musculoskeletal ROS (+)    Abdominal Normal abdominal exam  (+)   Peds negative pediatric ROS (+)  Hematology negative hematology ROS (+)   Anesthesia Other Findings Past Medical History: No date: Elevated blood pressure     Comment:  NO MEDS No date: GERD (gastroesophageal reflux disease) No date: Hypertension No date: Neuromuscular disorder (HCC) No date: Sleep apnea  Past Surgical History: No date: NO PAST SURGERIES  BMI    Body Mass Index: 34.56 kg/m      Reproductive/Obstetrics negative OB ROS                              Anesthesia Physical Anesthesia Plan  ASA: 2  Anesthesia Plan: General   Post-op Pain Management:    Induction: Intravenous  PONV Risk Score and Plan: Propofol infusion and TIVA  Airway Management Planned: Natural Airway and Nasal Cannula  Additional Equipment:   Intra-op Plan:   Post-operative Plan:   Informed Consent: I have reviewed the patients History and Physical, chart, labs and discussed the procedure including the risks, benefits and alternatives for the  proposed anesthesia with the patient or authorized representative who has indicated his/her understanding and acceptance.     Dental Advisory Given  Plan Discussed with: Anesthesiologist, CRNA and Surgeon  Anesthesia Plan Comments:          Anesthesia Quick Evaluation

## 2023-08-23 NOTE — Anesthesia Postprocedure Evaluation (Signed)
Anesthesia Post Note  Patient: Peter Porter  Procedure(s) Performed: ESOPHAGOGASTRODUODENOSCOPY (EGD) WITH PROPOFOL COLONOSCOPY WITH PROPOFOL  Patient location during evaluation: PACU Anesthesia Type: General Level of consciousness: awake and awake and alert Pain management: pain level controlled Vital Signs Assessment: post-procedure vital signs reviewed and stable Respiratory status: spontaneous breathing Cardiovascular status: blood pressure returned to baseline Anesthetic complications: no   No notable events documented.   Last Vitals:  Vitals:   08/23/23 0923 08/23/23 0933  BP: 116/82 (!) 126/96  Pulse: (!) 59 (!) 57  Resp: 14 12  Temp: (!) 35.7 C   SpO2: 97% 98%    Last Pain:  Vitals:   08/23/23 0933  TempSrc:   PainSc: 0-No pain                 VAN STAVEREN,Mare Ludtke

## 2023-08-23 NOTE — Op Note (Signed)
[facility] Gastroenterology Patient Name: Peter Porter Procedure Date: 08/23/2023 8:41 AM MRN: [id number] Account #: [id number] Date of Birth: January 17, 1976 Admit Type: Outpatient Age: 47 Room: [facility] ENDO ROOM 4 Gender: Male Note Status: Finalized Instrument Name: Nelda Marseille [id number] Procedure:             Colonoscopy Indications:           Screening for colorectal malignant neoplasm Providers:             Wyline Mood MD, MD Referring MD:          Debera Lat (Referring MD) Medicines:             Monitored Anesthesia Care Complications:         No immediate complications. Procedure:             Pre-Anesthesia Assessment:                        - Prior to the procedure, a History and Physical was                         performed, and patient medications, allergies and                         sensitivities were reviewed. The patient's tolerance                         of previous anesthesia was reviewed.                        - The risks and benefits of the procedure and the                         sedation options and risks were discussed with the                         patient. All questions were answered and informed                         consent was obtained.                        - ASA Grade Assessment: II - A patient with mild                         systemic disease.                        After obtaining informed consent, the colonoscope was                         passed under direct vision. Throughout the procedure,                         the patient's blood pressure, pulse, and oxygen                         saturations were monitored continuously. The                         Colonoscope was introduced through  the anus and                         advanced to the the cecum, identified by the                         appendiceal orifice. The colonoscopy was performed                         with ease. The patient tolerated the procedure well.                          The quality of the bowel preparation was excellent.                         The ileocecal valve, appendiceal orifice, and rectum                         were photographed. Findings:      The perianal and digital rectal examinations were normal.      The entire examined colon appeared normal on direct and retroflexion       views. Impression:            - The entire examined colon is normal on direct and                         retroflexion views.                        - No specimens collected. Recommendation:        - Discharge patient to home (with escort).                        - Resume previous diet.                        - Continue present medications.                        - Repeat colonoscopy in 10 years for screening                         purposes. Procedure Code(s):     --- Professional ---                        [phone], Colonoscopy, flexible; diagnostic, including                         collection of specimen(s) by brushing or washing, when                         performed (separate procedure) Diagnosis Code(s):     --- Professional ---                        Z12.11, Encounter for screening for malignant neoplasm                         of colon CPT copyright 2022 American Medical Association. All rights reserved. The codes documented in this report are  preliminary and upon coder review may  be revised to meet current compliance requirements. Wyline Mood, MD Wyline Mood MD, MD 08/23/2023 9:11:11 AM This report has been signed electronically. Number of Addenda: 0 Note Initiated On: 08/23/2023 8:41 AM Scope Withdrawal Time: 0 hours 9 minutes 12 seconds  Total Procedure Duration: 0 hours 11 minutes 15 seconds  Estimated Blood Loss:  Estimated blood loss: none.      [facility]

## 2023-08-23 NOTE — Op Note (Signed)
[facility] Gastroenterology Patient Name: Peter Porter Procedure Date: 08/23/2023 8:48 AM MRN: [id number] Account #: [id number] Date of Birth: [birth year] Admit Type: Outpatient Age: 47 Room: [facility] ENDO ROOM 4 Gender: Male Note Status: Finalized Instrument Name: Laurette Schimke [id number] Procedure:             Upper GI endoscopy Indications:           Dyspepsia Providers:             Wyline Mood MD, MD Referring MD:          Debera Lat (Referring MD) Medicines:             Monitored Anesthesia Care Complications:         No immediate complications. Procedure:             Pre-Anesthesia Assessment:                        - Prior to the procedure, a History and Physical was                         performed, and patient medications, allergies and                         sensitivities were reviewed. The patient's tolerance                         of previous anesthesia was reviewed.                        - The risks and benefits of the procedure and the                         sedation options and risks were discussed with the                         patient. All questions were answered and informed                         consent was obtained.                        - ASA Grade Assessment: II - A patient with mild                         systemic disease.                        After obtaining informed consent, the endoscope was                         passed under direct vision. Throughout the procedure,                         the patient's blood pressure, pulse, and oxygen                         saturations were monitored continuously. The Endoscope                         was introduced through the mouth,  and advanced to the                         third part of duodenum. The upper GI endoscopy was                         accomplished with ease. The patient tolerated the                         procedure well. Findings:      The esophagus was normal.       The stomach was normal.      The examined duodenum was normal. Impression:            - Normal esophagus.                        - Normal stomach.                        - Normal examined duodenum.                        - No specimens collected. Recommendation:        - Perform a colonoscopy today. Procedure Code(s):     --- Professional ---                        [phone], Esophagogastroduodenoscopy, flexible,                         transoral; diagnostic, including collection of                         specimen(s) by brushing or washing, when performed                         (separate procedure) Diagnosis Code(s):     --- Professional ---                        R10.13, Epigastric pain CPT copyright 2022 American Medical Association. All rights reserved. The codes documented in this report are preliminary and upon coder review may  be revised to meet current compliance requirements. Wyline Mood, MD Wyline Mood MD, MD 08/23/2023 8:57:00 AM This report has been signed electronically. Number of Addenda: 0 Note Initiated On: 08/23/2023 8:48 AM Estimated Blood Loss:  Estimated blood loss: none.      [facility]

## 2023-08-23 NOTE — Transfer of Care (Signed)
Immediate Anesthesia Transfer of Care Note  Patient: Peter Porter  Procedure(s) Performed: ESOPHAGOGASTRODUODENOSCOPY (EGD) WITH PROPOFOL COLONOSCOPY WITH PROPOFOL  Patient Location: PACU  Anesthesia Type:General  Level of Consciousness: awake, alert , and oriented  Airway & Oxygen Therapy: Patient Spontanous Breathing  Post-op Assessment: Report given to RN and Post -op Vital signs reviewed and stable  Post vital signs: Reviewed and stable  Last Vitals:  Vitals Value Taken Time  BP 114/70 08/23/23 0913  Temp    Pulse 62 08/23/23 0913  Resp 14 08/23/23 0913  SpO2 97 % 08/23/23 0913  Vitals shown include unfiled device data.  Last Pain:  Vitals:   08/23/23 0819  TempSrc: Temporal  PainSc: 0-No pain         Complications: No notable events documented.

## 2023-08-24 NOTE — Progress Notes (Signed)
Non-identified Voicemail.  No Message Left.

## 2023-08-26 ENCOUNTER — Encounter: Payer: Self-pay | Admitting: Gastroenterology

## 2023-09-19 NOTE — Progress Notes (Deleted)
    Celso Amy, PA-C [address]  [address]  [place], [place] [address]  Main: [phone]  Fax: [phone]   Primary Care Physician: Debera Lat, PA-C  Primary Gastroenterologist:  ***  CC:  HPI: Peter Porter is a 47 y.o. male returns for 47-month follow-up of epigastric pain and colon cancer screening.  He has been taking omeprazole 20 Mg twice daily.  We are using Spanish interpreter today.  EGD 08/23/2023 by Dr. Tobi Bastos was normal.  Colonoscopy 08/23/2023 by Dr. Tobi Bastos was normal.  Excellent prep.  10-year repeat.  Current Outpatient Medications  Medication Sig Dispense Refill   hydrochlorothiazide (MICROZIDE) 12.5 MG capsule TAKE 1 CAPSULE (12.5 MG TOTAL) BY MOUTH TWICE A DAY 180 capsule 1   omeprazole (PRILOSEC) 20 MG capsule TAKE 1 CAPSULE (20 MG TOTAL) BY MOUTH 2 (TWO) TIMES DAILY BEFORE A MEAL. 180 capsule 1   No current facility-administered medications for this visit.    Allergies as of 09/20/2023   (No Known Allergies)    Past Medical History:  Diagnosis Date   Elevated blood pressure    NO MEDS   GERD (gastroesophageal reflux disease)    Hypertension    Neuromuscular disorder (HCC)    Sleep apnea     Past Surgical History:  Procedure Laterality Date   COLONOSCOPY WITH PROPOFOL N/A 08/23/2023   Procedure: COLONOSCOPY WITH PROPOFOL;  Surgeon: Wyline Mood, MD;  Location: [facility] ENDOSCOPY;  Service: Gastroenterology;  Laterality: N/A;   ESOPHAGOGASTRODUODENOSCOPY (EGD) WITH PROPOFOL N/A 08/23/2023   Procedure: ESOPHAGOGASTRODUODENOSCOPY (EGD) WITH PROPOFOL;  Surgeon: Wyline Mood, MD;  Location: [facility] ENDOSCOPY;  Service: Gastroenterology;  Laterality: N/A;  SPANISH INTERPRETER   NO PAST SURGERIES      Review of Systems:    All systems reviewed and negative except where noted in HPI.   Physical Examination:   There were no vitals taken for this visit.  General: Well-nourished, well-developed in no acute distress.  Lungs: Clear to auscultation  bilaterally. Non-labored. Heart: Regular rate and rhythm, no murmurs rubs or gallops.  Abdomen: Bowel sounds are normal; Abdomen is Soft; No hepatosplenomegaly, masses or hernias;  No Abdominal Tenderness; No guarding or rebound tenderness. Neuro: Alert and oriented x 3.  Grossly intact.  Psych: Alert and cooperative, normal mood and affect.   Imaging Studies: No results found.  Assessment and Plan:   Peter Porter is a 47 y.o. y/o male returns for 71-month follow-up of chronic acid reflux.  Recent EGD and first screening colonoscopy were normal.  10-year repeat colonoscopy recommended.  1.  Acid reflux  Reassurance regarding recent normal EGD  Continue omeprazole 20 Mg twice daily  Switch to different PPI  Add famotidine  GERD diet    Celso Amy, PA-C  Follow up as needed.  BP check ***

## 2023-09-20 ENCOUNTER — Ambulatory Visit: Payer: BC Managed Care – PPO | Admitting: Physician Assistant

## 2023-09-30 ENCOUNTER — Telehealth: Payer: Self-pay | Admitting: Physician Assistant

## 2023-10-04 ENCOUNTER — Ambulatory Visit: Payer: BC Managed Care – PPO | Admitting: Physician Assistant

## 2023-10-20 NOTE — Progress Notes (Unsigned)
Established patient visit  Patient: Peter Porter   DOB: 07-Jul-1976   47 y.o. Male  MRN: [id number] Visit Date: 10/25/2023  Today's healthcare provider: Debera Lat, PA-C   Chief Complaint  Patient presents with   Follow-up    Concerns for stomach test and colon that was taken a couple months ago    Subjective     Discussed the use of AI scribe software for clinical note transcription with the patient, who gave verbal consent to proceed.  History of Present Illness   The patient, with a history of GERD, high triglycerides, and hypertension, presents with concerns about persistently high blood pressure despite adherence to prescribed medication. They report taking their blood pressure medication twice daily as instructed. They also express that they have been following dietary recommendations to manage their GERD and high triglycerides, including avoiding foods such as flour and soda. They note an improvement in their GERD symptoms since starting omeprazole twice daily and taking omega-3 supplements. However, they express concern that their blood pressure remains high, often measuring around 150, despite their adherence to medication and dietary changes. The patient also mentions experiencing numbness in parts of their body prior to starting the omega-3 supplements, but reports that this symptom has improved since starting the supplement.     Ramon#[id number] The 10-year ASCVD risk score (Arnett DK, et al., 2019) is: 3.2%      10/25/2023    3:44 PM 04/05/2023    3:52 PM 01/21/2023    2:25 PM  Depression screen PHQ 2/9  Decreased Interest 0 0 1  Down, Depressed, Hopeless 0 0 0  PHQ - 2 Score 0 0 1  Altered sleeping  0 1  Tired, decreased energy  0 1  Change in appetite  0 0  Feeling bad or failure about yourself   0 0  Trouble concentrating  0 0  Moving slowly or fidgety/restless  0 0  Suicidal thoughts  0 0  PHQ-9 Score  0 3  Difficult doing work/chores   Not difficult at all        No data to display          Medications: Outpatient Medications Prior to Visit  Medication Sig   hydrochlorothiazide (MICROZIDE) 12.5 MG capsule TAKE 1 CAPSULE (12.5 MG TOTAL) BY MOUTH TWICE A DAY   omeprazole (PRILOSEC) 20 MG capsule TAKE 1 CAPSULE (20 MG TOTAL) BY MOUTH 2 (TWO) TIMES DAILY BEFORE A MEAL.   No facility-administered medications prior to visit.    Review of Systems Except see HPI   {Insert previous labs (optional):23779} {See past labs  Heme  Chem  Endocrine  Serology  Results Review (optional):1}   Objective    BP (!) 148/104 (BP Location: Right Arm, Patient Position: Sitting, Cuff Size: Large)   Pulse 78   Temp 97.6 F (36.4 C)   Resp 16   Ht 5\' 9"  (1.753 m)   Wt 243 lb 1.6 oz (110.3 kg)   SpO2 98%   BMI 35.90 kg/m  {Insert last BP/Wt (optional):23777}{See vitals history (optional):1}   Physical Exam   No results found for any visits on 10/25/23.  Assessment & Plan    *** Assessment and Plan    Gastroesophageal Reflux Disease (GERD) Improvement noted with adherence to dietary modifications and Omeprazole 20 mcg twice daily. -Continue Omeprazole 20 mcg twice daily. -Continue dietary modifications.  Hypertension Uncontrolled despite current medication (Hydrochlorothiazide). -Add Valsartan to current regimen.  Hypertriglyceridemia Discussed but no specific plan mentioned in  the conversation.  Colonoscopy Normal results. -Repeat colonoscopy in 10 years.         Return in about 4 weeks (around 11/22/2023) for chronic disease f/u, BP f/u.      Sarah Bush [facility]

## 2023-10-25 ENCOUNTER — Ambulatory Visit: Payer: BC Managed Care – PPO | Admitting: Physician Assistant

## 2023-10-25 VITALS — BP 148/104 | HR 78 | Temp 97.6°F | Resp 16 | Ht 69.0 in | Wt 243.1 lb

## 2023-10-25 DIAGNOSIS — E669 Obesity, unspecified: Secondary | ICD-10-CM | POA: Diagnosis not present

## 2023-10-25 DIAGNOSIS — E781 Pure hyperglyceridemia: Secondary | ICD-10-CM

## 2023-10-25 DIAGNOSIS — I10 Essential (primary) hypertension: Secondary | ICD-10-CM | POA: Diagnosis not present

## 2023-10-25 DIAGNOSIS — K219 Gastro-esophageal reflux disease without esophagitis: Secondary | ICD-10-CM

## 2023-10-25 MED ORDER — VALSARTAN 40 MG PO TABS
40.0000 mg | ORAL_TABLET | Freq: Every day | ORAL | 3 refills | Status: DC
Start: 2023-10-25 — End: 2023-10-25

## 2023-10-25 MED ORDER — VALSARTAN 40 MG PO TABS
40.0000 mg | ORAL_TABLET | Freq: Two times a day (BID) | ORAL | 3 refills | Status: DC
Start: 2023-10-25 — End: 2024-02-28

## 2023-10-27 ENCOUNTER — Encounter: Payer: Self-pay | Admitting: Physician Assistant

## 2023-12-06 ENCOUNTER — Ambulatory Visit: Payer: BC Managed Care – PPO | Admitting: Physician Assistant

## 2023-12-06 ENCOUNTER — Encounter: Payer: Self-pay | Admitting: Physician Assistant

## 2023-12-06 VITALS — BP 137/87 | HR 66 | Resp 16 | Ht 69.0 in | Wt 240.6 lb

## 2023-12-06 DIAGNOSIS — K921 Melena: Secondary | ICD-10-CM | POA: Diagnosis not present

## 2023-12-06 DIAGNOSIS — H11003 Unspecified pterygium of eye, bilateral: Secondary | ICD-10-CM

## 2023-12-06 DIAGNOSIS — I10 Essential (primary) hypertension: Secondary | ICD-10-CM | POA: Diagnosis not present

## 2023-12-06 DIAGNOSIS — K219 Gastro-esophageal reflux disease without esophagitis: Secondary | ICD-10-CM | POA: Diagnosis not present

## 2023-12-06 NOTE — Progress Notes (Signed)
Established patient visit  Patient: Peter Porter   DOB: 04-05-1976   48 y.o. Male  MRN: [id number] Visit Date: 12/06/2023  Today's healthcare provider: Debera Lat, PA-C   Chief Complaint  Patient presents with   Medical Management of Chronic Issues    4 -5 weeks follow BP   Subjective     HPI     Medical Management of Chronic Issues    Additional comments: 4 -5 weeks follow BP      Last edited by Marjie Skiff, CMA on 12/06/2023  8:38 AM.       Discussed the use of AI scribe software for clinical note transcription with the patient, who gave verbal consent to proceed.  History of Present Illness   The patient, with a history of hypertension and acid reflux, reports good control of their acid reflux symptoms. However, they have noted occasional high blood pressure readings at home. They are currently on two antihypertensive medications, hydrochlorothiazide and valsartan, which they take once daily. They also take omeprazole for their acid reflux. They have noticed their stool occasionally appears black, but deny any other gastrointestinal symptoms. They also have a growth in their eye, for which they use eye drops and sunglasses for protection. They have no family history of colon cancer.     Peter Porter #[id number]      12/06/2023    8:36 AM 10/25/2023    3:44 PM 04/05/2023    3:52 PM  Depression screen PHQ 2/9  Decreased Interest 0 0 0  Down, Depressed, Hopeless 0 0 0  PHQ - 2 Score 0 0 0  Altered sleeping   0  Tired, decreased energy   0  Change in appetite   0  Feeling bad or failure about yourself    0  Trouble concentrating   0  Moving slowly or fidgety/restless   0  Suicidal thoughts   0  PHQ-9 Score   0      12/06/2023    8:36 AM  GAD 7 : Generalized Anxiety Score  Nervous, Anxious, on Edge 0  Control/stop worrying 0  Worry too much - different things 0  Trouble relaxing 0  Restless 0  Easily annoyed or irritable 0  Afraid - awful might happen 0   Total GAD 7 Score 0  Anxiety Difficulty Not difficult at all    Medications: Outpatient Medications Prior to Visit  Medication Sig   hydrochlorothiazide (MICROZIDE) 12.5 MG capsule TAKE 1 CAPSULE (12.5 MG TOTAL) BY MOUTH TWICE A DAY   omeprazole (PRILOSEC) 20 MG capsule TAKE 1 CAPSULE (20 MG TOTAL) BY MOUTH 2 (TWO) TIMES DAILY BEFORE A MEAL.   valsartan (DIOVAN) 40 MG tablet Take 1 tablet (40 mg total) by mouth 2 (two) times daily.   No facility-administered medications prior to visit.    Review of Systems All negative Except see HPI       Objective    BP 137/87 (BP Location: Left Arm, Patient Position: Sitting) Comment: at home this morning  Pulse 66   Resp 16   Ht 5\' 9"  (1.753 m)   Wt 240 lb 9.6 oz (109.1 kg)   BMI 35.53 kg/m     Physical Exam Vitals reviewed.  Constitutional:      General: He is not in acute distress.    Appearance: Normal appearance. He is not diaphoretic.  HENT:     Head: Normocephalic and atraumatic.  Eyes:     General: No scleral icterus.  Conjunctiva/sclera: Conjunctivae normal.  Cardiovascular:     Rate and Rhythm: Normal rate and regular rhythm.     Pulses: Normal pulses.     Heart sounds: Normal heart sounds. No murmur heard. Pulmonary:     Effort: Pulmonary effort is normal. No respiratory distress.     Breath sounds: Normal breath sounds. No wheezing or rhonchi.  Musculoskeletal:     Cervical back: Neck supple.     Right lower leg: No edema.     Left lower leg: No edema.  Lymphadenopathy:     Cervical: No cervical adenopathy.  Skin:    General: Skin is warm and dry.     Findings: No rash.  Neurological:     Mental Status: He is alert and oriented to person, place, and time. Mental status is at baseline.  Psychiatric:        Mood and Affect: Mood normal.        Behavior: Behavior normal.      No results found for any visits on 12/06/23.      Assessment and Plan    Hypertension Blood pressure readings have been  intermittently high. Patient is currently on Hydrochlorothiazide 25mg  and Valsartan, with unclear dosing regimen. -Increase Hydrochlorothiazide to twice daily. -Clarify Valsartan dosing and ensure patient is taking as prescribed.  Gastroesophageal Reflux Disease (GERD) Patient reports improvement in symptoms. -Continue current management.  Pterygium Patient has been advised on sun protection and use of artificial tears to prevent progression. No current visual disturbances reported. -Continue sun protection and artificial tears. -Documented pterygium with photographs for future comparison.  Melena Patient reports occasional black stools.No current symptoms of gastrointestinal distress.  -Advise patient to monitor stool color and note any associated dietary changes. -If blood in stool or gastrointestinal distress develops, re-evaluate. Consider h pylori, check on alcohol intake  Follow-up in one month.        No orders of the defined types were placed in this encounter.   Return in about 4 weeks (around 01/03/2024) for BP f/u.   The patient was advised to call back or seek an in-person evaluation if the symptoms worsen or if the condition fails to improve as anticipated.  I discussed the assessment and treatment plan with the patient. The patient was provided an opportunity to ask questions and all were answered. The patient agreed with the plan and demonstrated an understanding of the instructions.  I, Debera Lat, PA-C have reviewed all documentation for this visit. The documentation on 12/06/2023  for the exam, diagnosis, procedures, and orders are all accurate and complete.  Debera Lat, [facility], MMS [facility] [phone] (phone) [phone] (fax)  [facility]

## 2024-01-03 ENCOUNTER — Ambulatory Visit: Payer: BC Managed Care – PPO | Admitting: Physician Assistant

## 2024-01-03 ENCOUNTER — Encounter: Payer: Self-pay | Admitting: Physician Assistant

## 2024-01-03 VITALS — BP 138/93 | HR 73 | Resp 16 | Wt 242.0 lb

## 2024-01-03 DIAGNOSIS — I10 Essential (primary) hypertension: Secondary | ICD-10-CM

## 2024-01-03 DIAGNOSIS — E781 Pure hyperglyceridemia: Secondary | ICD-10-CM

## 2024-01-03 DIAGNOSIS — K219 Gastro-esophageal reflux disease without esophagitis: Secondary | ICD-10-CM

## 2024-01-03 DIAGNOSIS — D3192 Benign neoplasm of unspecified part of left eye: Secondary | ICD-10-CM

## 2024-01-03 DIAGNOSIS — K921 Melena: Secondary | ICD-10-CM | POA: Diagnosis not present

## 2024-01-03 DIAGNOSIS — R748 Abnormal levels of other serum enzymes: Secondary | ICD-10-CM

## 2024-01-03 NOTE — Progress Notes (Signed)
 Established patient visit  Patient: Peter Porter   DOB: September 19, 1976   48 y.o. Male  MRN: [id number] Visit Date: 01/03/2024  Today's healthcare provider: Debera Lat, PA-C   Chief Complaint  Patient presents with   Medical Management of Chronic Issues    1 month follow-up BP   Subjective      Discussed the use of AI scribe software for clinical note transcription with the patient, who gave verbal consent to proceed.  History of Present Illness   The patient with a history of hypertension, presents with concerns about his blood pressure. He has been monitoring his blood pressure at home and reports readings of 138/95 and 93, which he acknowledges are still above the target range of less than 130/90. He has been adhering to his prescribed medication regimen, which includes hydrochlorothiazide and valsartan, and has made dietary changes to reduce his salt intake. However, he has not been able to exercise regularly due to cold weather.  In addition to his hypertension, pt has noticed a small mass on his foot. He first noticed the mass while tying his shoes and reports that it has grown slightly since he first noticed it. The mass does not cause him any discomfort or other symptoms.  The pt also mentions that he has been managing his acid reflux well and has not experienced any recent gastrointestinal symptoms such as pain, nausea, vomiting, diarrhea, or constipation. He has not noticed any swelling or other changes in his body.     Daphine Deutscher #[id number] interpreter      12/06/2023    8:36 AM 10/25/2023    3:44 PM 04/05/2023    3:52 PM  Depression screen PHQ 2/9  Decreased Interest 0 0 0  Down, Depressed, Hopeless 0 0 0  PHQ - 2 Score 0 0 0  Altered sleeping   0  Tired, decreased energy   0  Change in appetite   0  Feeling bad or failure about yourself    0  Trouble concentrating   0  Moving slowly or fidgety/restless   0  Suicidal thoughts   0  PHQ-9 Score   0      12/06/2023     8:36 AM  GAD 7 : Generalized Anxiety Score  Nervous, Anxious, on Edge 0  Control/stop worrying 0  Worry too much - different things 0  Trouble relaxing 0  Restless 0  Easily annoyed or irritable 0  Afraid - awful might happen 0  Total GAD 7 Score 0  Anxiety Difficulty Not difficult at all    Medications: Outpatient Medications Prior to Visit  Medication Sig   hydrochlorothiazide (MICROZIDE) 12.5 MG capsule TAKE 1 CAPSULE (12.5 MG TOTAL) BY MOUTH TWICE A DAY   omeprazole (PRILOSEC) 20 MG capsule TAKE 1 CAPSULE (20 MG TOTAL) BY MOUTH 2 (TWO) TIMES DAILY BEFORE A MEAL.   valsartan (DIOVAN) 40 MG tablet Take 1 tablet (40 mg total) by mouth 2 (two) times daily.   No facility-administered medications prior to visit.    Review of Systems All negative Except see HPI       Objective    BP (!) 138/93 (BP Location: Right Arm, Patient Position: Sitting, Cuff Size: Large)   Pulse 73   Resp 16   Wt 242 lb (109.8 kg)   BMI 35.74 kg/m     Physical Exam   No results found for any visits on 01/03/24.      Assessment and Plan    Hypertension Chronic  and unstable Blood pressure readings at home have improved but are still above target (138/95). Currently on Hydrochlorothiazide 25mg  and Valsartan 40mg  twice daily. -Increase Valsartan to 80mg  in the evening and continue 40mg  in the morning. -Continue Hydrochlorothiazide 25mg  twice daily. -Check blood pressure at home and bring records to next appointment.  Hyperlipidemia and Elevated Liver Enzymes Previous labs showed elevated lipid levels and liver enzymes. -Order repeat lipid panel and liver function tests. Pt 's labs from 10/2023 are still active. Advised to proceed with labs -Advise patient to fast for 8-10 hours prior to blood draw.  Soft Tissue Mass on thigh Noticed a small (5-24mm), soft, non-pulsatile mass on foot. No associated pain or discomfort. Most likely benign etiology -Advise patient to monitor the mass for any  changes in size, color, or symptoms.  Eye Health No current issues with vision. -Advise patient to continue using sunglasses for sun protection.  Follow-up in 2 months.      No orders of the defined types were placed in this encounter.   Return in about 2 months (around 03/02/2024) for chronic disease f/u, BP f/u.   The patient was advised to call back or seek an in-person evaluation if the symptoms worsen or if the condition fails to improve as anticipated.  I discussed the assessment and treatment plan with the patient. The patient was provided an opportunity to ask questions and all were answered. The patient agreed with the plan and demonstrated an understanding of the instructions.  I, Debera Lat, PA-C have reviewed all documentation for this visit. The documentation on 01/03/2024  for the exam, diagnosis, procedures, and orders are all accurate and complete.  Debera Lat, [facility], MMS [facility] [phone] (phone) [phone] (fax)  [facility]

## 2024-01-22 ENCOUNTER — Other Ambulatory Visit: Payer: Self-pay | Admitting: Physician Assistant

## 2024-01-22 DIAGNOSIS — K219 Gastro-esophageal reflux disease without esophagitis: Secondary | ICD-10-CM

## 2024-01-22 DIAGNOSIS — I10 Essential (primary) hypertension: Secondary | ICD-10-CM

## 2024-01-22 NOTE — Telephone Encounter (Signed)
 Requested Prescriptions  Pending Prescriptions Disp Refills   omeprazole (PRILOSEC) 20 MG capsule [Pharmacy Med Name: OMEPRAZOLE DR 20 MG CAPSULE] 180 capsule 0    Sig: TAKE 1 CAPSULE (20 MG TOTAL) BY MOUTH 2 (TWO) TIMES DAILY BEFORE A MEAL.     Gastroenterology: Proton Pump Inhibitors Passed - 01/22/2024  2:58 PM      Passed - Valid encounter within last 12 months    Recent Outpatient Visits           1 month ago Gastroesophageal reflux disease without esophagitis   [place] [facility] [place], [place], PA-C   2 months ago Primary hypertension   [place] [facility] [place], [place], PA-C   8 months ago Primary hypertension   [place] [facility] [place], [place], PA-C   9 months ago Gastroesophageal reflux disease without esophagitis   [place] [facility] [place], [place], PA-C   11 months ago Annual physical exam   [place] [facility] [place], [place], PA-C       Future Appointments             In 1 month [place], [place], PA-C [place] [facility]             hydrochlorothiazide (MICROZIDE) 12.5 MG capsule [employer] Med Name: HYDROCHLOROTHIAZIDE 12.5 MG CP] 180 capsule 0    Sig: TAKE 1 CAPSULE (12.5 MG TOTAL) BY MOUTH TWICE A DAY     Cardiovascular: Diuretics - Thiazide Failed - 01/22/2024  2:58 PM      Failed - Cr in normal range and within 180 days    Creatinine, Ser  Date Value Ref Range Status  01/25/2023 0.81 0.76 - 1.27 mg/dL Final         Failed - K in normal range and within 180 days    Potassium  Date Value Ref Range Status  01/25/2023 4.5 3.5 - 5.2 mmol/L Final         Failed - Na in normal range and within 180 days    Sodium  Date Value Ref Range Status  01/25/2023 137 134 - 144 mmol/L Final         Failed - Last BP in normal range    BP Readings from Last 1 Encounters:  01/03/24 (!) 138/93         Passed - Valid encounter within last 6  months    Recent Outpatient Visits           1 month ago Gastroesophageal reflux disease without esophagitis   [facility] [place], [place], PA-C   2 months ago Primary hypertension   [place] [facility] [place], [place], PA-C   8 months ago Primary hypertension   [place] [facility] [place], [place], PA-C   9 months ago Gastroesophageal reflux disease without esophagitis    [facility] [place], [place], PA-C   11 months ago Annual physical exam    [facility] [place], [place], PA-C       Future Appointments             In 1 month Ostwalt, [place], PA-C  [facility]

## 2024-02-20 LAB — LIPID PANEL
Chol/HDL Ratio: 5.6 ratio — ABNORMAL HIGH (ref 0.0–5.0)
Cholesterol, Total: 167 mg/dL (ref 100–199)
HDL: 30 mg/dL — ABNORMAL LOW (ref >39)
LDL Chol Calc (NIH): 54 mg/dL (ref 0–99)
Triglycerides: 563 mg/dL (ref 0–149)
VLDL Cholesterol Cal: 83 mg/dL — ABNORMAL HIGH (ref 5–40)

## 2024-02-20 LAB — COMPREHENSIVE METABOLIC PANEL WITH GFR
ALT: 41 IU/L (ref 0–44)
AST: 26 IU/L (ref 0–40)
Albumin: 4.4 g/dL (ref 4.1–5.1)
Alkaline Phosphatase: 79 IU/L (ref 44–121)
BUN/Creatinine Ratio: 15 (ref 9–20)
BUN: 13 mg/dL (ref 6–24)
Bilirubin Total: 0.3 mg/dL (ref 0.0–1.2)
CO2: 22 mmol/L (ref 20–29)
Calcium: 9.2 mg/dL (ref 8.7–10.2)
Chloride: 100 mmol/L (ref 96–106)
Creatinine, Ser: 0.87 mg/dL (ref 0.76–1.27)
Globulin, Total: 3.1 g/dL (ref 1.5–4.5)
Glucose: 86 mg/dL (ref 70–99)
Potassium: 4.3 mmol/L (ref 3.5–5.2)
Sodium: 138 mmol/L (ref 134–144)
Total Protein: 7.5 g/dL (ref 6.0–8.5)
eGFR: 108 mL/min/{1.73_m2} (ref >59)

## 2024-02-20 NOTE — Progress Notes (Signed)
 Needs spanish interpreter Elevated triglycerides (563 mg/dL) with low LDL (54 mg/dL) and low HDL (30 mg/dL) can be caused by secondary factors such as poorly controlled diabetes - not in your case,   Obesity and high dietary intake of refined carbohydrates or alcohol can elevate triglycerides and lower HDL    We will recheck his cholesterol during your follow-up. He needs to fast for 8 - 10 hours before the test

## 2024-02-28 ENCOUNTER — Encounter: Payer: Self-pay | Admitting: Physician Assistant

## 2024-02-28 ENCOUNTER — Ambulatory Visit: Payer: BC Managed Care – PPO | Admitting: Physician Assistant

## 2024-02-28 VITALS — BP 120/79 | HR 71 | Resp 16 | Ht 69.0 in | Wt 236.7 lb

## 2024-02-28 DIAGNOSIS — K219 Gastro-esophageal reflux disease without esophagitis: Secondary | ICD-10-CM

## 2024-02-28 DIAGNOSIS — E781 Pure hyperglyceridemia: Secondary | ICD-10-CM | POA: Diagnosis not present

## 2024-02-28 DIAGNOSIS — I10 Essential (primary) hypertension: Secondary | ICD-10-CM

## 2024-02-28 DIAGNOSIS — R748 Abnormal levels of other serum enzymes: Secondary | ICD-10-CM | POA: Diagnosis not present

## 2024-02-28 MED ORDER — VALSARTAN 40 MG PO TABS: 40.0000 mg | ORAL_TABLET | Freq: Two times a day (BID) | ORAL | 1 refills | Status: DC

## 2024-02-28 MED ORDER — OMEPRAZOLE 20 MG PO CPDR: 20.0000 mg | DELAYED_RELEASE_CAPSULE | Freq: Two times a day (BID) | ORAL | 1 refills | Status: AC

## 2024-02-28 MED ORDER — HYDROCHLOROTHIAZIDE 12.5 MG PO CAPS: 12.5000 mg | ORAL_CAPSULE | Freq: Two times a day (BID) | ORAL | 1 refills | Status: AC

## 2024-02-28 NOTE — Progress Notes (Addendum)
 Established patient visit  Patient: Peter Porter   DOB: [birth year]   48 y.o. Male  MRN: [id number] Visit Date: 02/28/2024  Today's healthcare provider: Blane Bunting, PA-C   Chief Complaint  Patient presents with   Hypertension    BP f/u   Subjective     Discussed the use of AI scribe software for clinical note transcription with the patient, who gave verbal consent to proceed.  History of Present Illness The patient presents with concerns about a recent blood test showing high triglycerides. He reports feeling "a little weird" and "slowed down" in the week leading up to the test, but denies any specific symptoms such as pain. He denies any changes in diet or alcohol consumption around the time of the test. He also reports a desire to lower his triglycerides through medication, if necessary, and expresses concerns about the frequency of his medication refills.       02/28/2024    4:26 PM 12/06/2023    8:36 AM 10/25/2023    3:44 PM  Depression screen PHQ 2/9  Decreased Interest 0 0 0  Down, Depressed, Hopeless 0 0 0  PHQ - 2 Score 0 0 0  Altered sleeping 0    Tired, decreased energy 0    Change in appetite 0    Feeling bad or failure about yourself  0    Trouble concentrating 0    Moving slowly or fidgety/restless 0    Suicidal thoughts 0    PHQ-9 Score 0    Difficult doing work/chores Not difficult at all        02/28/2024    4:26 PM 12/06/2023    8:36 AM  GAD 7 : Generalized Anxiety Score  Nervous, Anxious, on Edge 0 0  Control/stop worrying 0 0  Worry too much - different things 0 0  Trouble relaxing 0 0  Restless 0 0  Easily annoyed or irritable 0 0  Afraid - awful might happen 0 0  Total GAD 7 Score 0 0  Anxiety Difficulty Not difficult at all Not difficult at all    Medications: Outpatient Medications Prior to Visit  Medication Sig   [DISCONTINUED] hydrochlorothiazide  (MICROZIDE ) 12.5 MG capsule TAKE 1 CAPSULE (12.5 MG TOTAL) BY MOUTH TWICE A DAY    [DISCONTINUED] omeprazole  (PRILOSEC) 20 MG capsule TAKE 1 CAPSULE (20 MG TOTAL) BY MOUTH 2 (TWO) TIMES DAILY BEFORE A MEAL.   [DISCONTINUED] valsartan  (DIOVAN ) 40 MG tablet Take 1 tablet (40 mg total) by mouth 2 (two) times daily.   No facility-administered medications prior to visit.    Review of Systems All negative Except see HPI       Objective    BP 120/79 (BP Location: Left Arm, Patient Position: Sitting)   Pulse 71   Resp 16   Ht 5\' 9"  (1.753 m)   Wt 236 lb 11.2 oz (107.4 kg)   SpO2 100%   BMI 34.95 kg/m     Physical Exam Vitals reviewed.  Constitutional:      General: He is not in acute distress.    Appearance: Normal appearance. He is not diaphoretic.  HENT:     Head: Normocephalic and atraumatic.  Eyes:     General: No scleral icterus.    Conjunctiva/sclera: Conjunctivae normal.  Cardiovascular:     Rate and Rhythm: Normal rate and regular rhythm.     Pulses: Normal pulses.     Heart sounds: Normal heart sounds. No murmur heard. Pulmonary:     Effort:  Pulmonary effort is normal. No respiratory distress.     Breath sounds: Normal breath sounds. No wheezing or rhonchi.  Musculoskeletal:     Cervical back: Neck supple.     Right lower leg: No edema.     Left lower leg: No edema.  Lymphadenopathy:     Cervical: No cervical adenopathy.  Skin:    General: Skin is warm and dry.     Findings: No rash.  Neurological:     Mental Status: He is alert and oriented to person, place, and time. Mental status is at baseline.  Psychiatric:        Mood and Affect: Mood normal.        Behavior: Behavior normal.      No results found for any visits on 02/28/24.      Assessment and Plan Assessment & Plan Hypertriglyceridemia Elevated triglycerides on most recent lab. Advised to confirm/repeat labs today. Pt did not fast. Will complete a test next week.   Reviewed labs for reversible causes: Normal thyroid function, normal renal function Elevated ALT 71. If it  will stay high, will do acute hepatitis panel. Advised on lifestyle management. - Advise healthy diet: a very low -fat diet, avoid refined carbohydrates and consume OTC omega-3FA, regular exercise, adequate hydration. - Instruct fasting before next blood test. - Discuss medication management if triglycerides  remain elevated.   HTN Chronic and stable Continue current regimen Will follow-up  Medication Management for HTN, GERD Agreed to 90-day medication supply to reduce pharmacy visits. Prescription verified for duration and dosage. - Prescribe 90-day supply of microzide  12.5 mg BID, valsartan  40mg  BID,  omeprazole  20mg  BID. - Ensure prescription duration and dosage are appropriate.  General Health Maintenance Advised on diet and exercise to support health and triglyceride management. - Advise balanced diet: substantial breakfast/lunch, lighter dinner. - Encourage regular exercise, adequate hydration. - Advise fasting 5-6 hours before bedtime.  Follow-up Advised follow-up after lipid panel to discuss results and treatment changes. - Schedule follow-up after lipid panel to discuss results and plan.    Orders Placed This Encounter  Procedures   Lipid panel    Has the patient fasted?:   Yes    No follow-ups on file.   The patient was advised to call back or seek an in-person evaluation if the symptoms worsen or if the condition fails to improve as anticipated.  I discussed the assessment and treatment plan with the patient. The patient was provided an opportunity to ask questions and all were answered. The patient agreed with the plan and demonstrated an understanding of the instructions.  I, Quint Chestnut, PA-C have reviewed all documentation for this visit. The documentation on 02/28/2024  for the exam, diagnosis, procedures, and orders are all accurate and complete.  Blane Bunting, [facility], MMS [facility] [phone] (phone) [phone] (fax)  [facility]

## 2024-02-28 NOTE — Addendum Note (Signed)
 Addended by: Harmonii Karle on: 02/28/2024 05:46 PM   Modules accepted: Orders

## 2024-03-02 DIAGNOSIS — I10 Essential (primary) hypertension: Secondary | ICD-10-CM | POA: Diagnosis not present

## 2024-03-03 ENCOUNTER — Telehealth: Payer: Self-pay

## 2024-03-03 LAB — LIPID PANEL
Chol/HDL Ratio: 4.9 ratio (ref 0.0–5.0)
Cholesterol, Total: 143 mg/dL (ref 100–199)
HDL: 29 mg/dL — ABNORMAL LOW (ref >39)
LDL Chol Calc (NIH): 71 mg/dL (ref 0–99)
Triglycerides: 262 mg/dL — ABNORMAL HIGH (ref 0–149)
VLDL Cholesterol Cal: 43 mg/dL — ABNORMAL HIGH (ref 5–40)

## 2024-03-03 MED ORDER — FENOFIBRATE 48 MG PO TABS: 48.0000 mg | ORAL_TABLET | Freq: Every day | ORAL | 1 refills | Status: DC

## 2024-03-03 NOTE — Progress Notes (Signed)
 Please, see me in a month or in 6 weeks

## 2024-03-03 NOTE — Telephone Encounter (Signed)
 Peter Porter

## 2024-03-03 NOTE — Addendum Note (Signed)
 Addended by: Clio Gerhart on: 03/03/2024 08:17 AM   Modules accepted: Orders

## 2024-03-25 ENCOUNTER — Other Ambulatory Visit: Payer: Self-pay | Admitting: Physician Assistant

## 2024-03-25 DIAGNOSIS — E781 Pure hyperglyceridemia: Secondary | ICD-10-CM

## 2024-08-28 ENCOUNTER — Other Ambulatory Visit: Payer: Self-pay | Admitting: Physician Assistant

## 2024-08-28 DIAGNOSIS — I10 Essential (primary) hypertension: Secondary | ICD-10-CM

## 2024-10-16 DIAGNOSIS — L820 Inflamed seborrheic keratosis: Secondary | ICD-10-CM | POA: Diagnosis not present

## 2024-10-24 ENCOUNTER — Other Ambulatory Visit: Payer: Self-pay | Admitting: Physician Assistant

## 2024-10-24 DIAGNOSIS — E781 Pure hyperglyceridemia: Secondary | ICD-10-CM

## 2024-10-27 ENCOUNTER — Other Ambulatory Visit: Payer: Self-pay | Admitting: Physician Assistant

## 2024-10-27 DIAGNOSIS — I10 Essential (primary) hypertension: Secondary | ICD-10-CM
